# Patient Record
Sex: Male | Born: 1940 | Race: White | Hispanic: No | Marital: Single | Smoking: Never smoker
Health system: Southern US, Community
[De-identification: ages and names within clinical notes are randomized; demographics above are authoritative.]

## PROBLEM LIST (undated history)

## (undated) DIAGNOSIS — R42 Dizziness and giddiness: Secondary | ICD-10-CM

## (undated) HISTORY — PX: APPENDECTOMY: SHX54

## (undated) HISTORY — PX: LEG SURGERY: SHX1003

## (undated) HISTORY — PX: HERNIA REPAIR: SHX51

---

## 2013-09-01 ENCOUNTER — Emergency Department (INDEPENDENT_AMBULATORY_CARE_PROVIDER_SITE_OTHER): Payer: BC Managed Care – PPO

## 2013-09-01 ENCOUNTER — Emergency Department (INDEPENDENT_AMBULATORY_CARE_PROVIDER_SITE_OTHER)
Admission: EM | Admit: 2013-09-01 | Discharge: 2013-09-01 | Disposition: A | Discharge status: Home / Self Care | Payer: BC Managed Care – PPO | Source: Home / Self Care | Attending: Family Medicine | Admitting: Family Medicine

## 2013-09-01 ENCOUNTER — Encounter (HOSPITAL_COMMUNITY): Payer: Self-pay | Admitting: Emergency Medicine

## 2013-09-01 DIAGNOSIS — J069 Acute upper respiratory infection, unspecified: Secondary | ICD-10-CM

## 2013-09-01 HISTORY — DX: Dizziness and giddiness: R42

## 2013-09-01 LAB — POCT RAPID STREP A: STREPTOCOCCUS, GROUP A SCREEN (DIRECT): NEGATIVE

## 2013-09-01 MED ORDER — BENZONATATE 100 MG PO CAPS: 100.0000 mg | ORAL_CAPSULE | Freq: Three times a day (TID) | ORAL | Status: DC | PRN

## 2013-09-01 MED ORDER — IPRATROPIUM BROMIDE 0.06 % NA SOLN: 2.0000 | Freq: Four times a day (QID) | NASAL | Status: AC

## 2013-09-01 NOTE — ED Notes (Signed)
Reports cough started Monday night ( 2/23).  Since then symptoms have worsened.  Cough is worse to the point of having "coughing spells" c/o sore throat, rarely productive cough, but has coughed up yellow phlegm.  Patient mentions pain in left chest and hip: says this is related to lying down too long.  No chest pain since then nor now

## 2013-09-01 NOTE — ED Provider Notes (Signed)
CSN: [id number]     Arrival date & time 09/01/13  4098 History   First MD Initiated Contact with Patient 09/01/13 [phone]     Chief Complaint  Patient presents with  . Sore Throat  . Cough   (Consider location/radiation/quality/duration/timing/severity/associated sxs/prior Treatment) HPI Comments: Non-smoker. Here from [place] visiting family  Patient is a 73 y.o. male presenting with cough and URI.  Cough Associated symptoms: rhinorrhea and sore throat   Associated symptoms: no ear pain, no fever, no headaches, no myalgias and no wheezing   URI Presenting symptoms: congestion, cough, rhinorrhea and sore throat   Presenting symptoms: no ear pain, no facial pain, no fatigue and no fever   Severity:  Moderate Onset quality:  Gradual Duration:  4 days Timing:  Constant Progression:  Unchanged Chronicity:  New Associated symptoms: no arthralgias, no headaches, no myalgias, no neck pain, no sinus pain, no sneezing, no swollen glands and no wheezing   Risk factors: recent travel     Past Medical History  Diagnosis Date  . Vertigo    Past Surgical History  Procedure Laterality Date  . Hernia repair    . Appendectomy    . Leg surgery     No family history on file. History  Substance Use Topics  . Smoking status: Never Smoker   . Smokeless tobacco: Not on file  . Alcohol Use: No    Review of Systems  Constitutional: Negative for fever and fatigue.  HENT: Positive for congestion, rhinorrhea and sore throat. Negative for ear pain and sneezing.   Respiratory: Positive for cough. Negative for wheezing.   Musculoskeletal: Negative for arthralgias, myalgias and neck pain.  Neurological: Negative for headaches.  All other systems reviewed and are negative.    Allergies  Codeine  Home Medications   Current Outpatient Rx  Name  Route  Sig  Dispense  Refill  . Pseudoeph-Doxylamine-DM-APAP (NYQUIL PO)   Oral   Take by mouth.         . Triamterene-HCTZ (DYAZIDE PO)  Oral   Take by mouth.         . benzonatate (TESSALON) 100 MG capsule   Oral   Take 1 capsule (100 mg total) by mouth 3 (three) times daily as needed for cough.   21 capsule   0   . ipratropium (ATROVENT) 0.06 % nasal spray   Each Nare   Place 2 sprays into both nostrils 4 (four) times daily.   15 mL   0    BP 129/76  Pulse 60  Temp(Src) 98.3 F (36.8 C) (Oral)  Resp 20  SpO2 97% Physical Exam  Nursing note and vitals reviewed. Constitutional: He is oriented to person, place, and time. He appears well-developed and well-nourished.  HENT:  Head: Normocephalic and atraumatic.  Right Ear: Hearing, tympanic membrane, external ear and ear canal normal.  Left Ear: Hearing, tympanic membrane, external ear and ear canal normal.  Nose: Nose normal.  Mouth/Throat: Uvula is midline and mucous membranes are normal. No oral lesions. No trismus in the jaw. Posterior oropharyngeal erythema present. No oropharyngeal exudate, posterior oropharyngeal edema or tonsillar abscesses.  Eyes: Conjunctivae are normal.  Neck: Normal range of motion. Neck supple.  Cardiovascular: Normal rate, regular rhythm and normal heart sounds.   Pulmonary/Chest: Effort normal and breath sounds normal.  Abdominal: Soft. Bowel sounds are normal. He exhibits no distension. There is no tenderness.  Musculoskeletal: Normal range of motion.  Lymphadenopathy:    He has no cervical adenopathy.  Neurological: He is alert and oriented to person, place, and time.  Skin: Skin is warm and dry. No rash noted.  Psychiatric: He has a normal mood and affect. His behavior is normal.    ED Course  Procedures (including critical care time) Labs Review Labs Reviewed  POCT RAPID STREP A ([facility] CARE ONLY)   Imaging Review Dg Chest 2 View  09/01/2013   CLINICAL DATA:  Cough, sore through for 1 week  EXAM: CHEST  2 VIEW  COMPARISON:  None.  FINDINGS: No active infiltrate or effusion is seen. Mediastinal contours appear  normal. The heart is within upper limits of normal. There are degenerative changes in the mid to lower thoracic spine.  IMPRESSION: No active lung disease.   Electronically Signed   By: Dwyane DeePaul  Barry M.D.   On: 09/01/2013 10:52     MDM   1. URI (upper respiratory infection)    Common Cold: rapid strep negative. CXR without bronchitis or pneumonia. Symptomatic care at home with good hydration, tylenol, atrovent nasal spray and tessalon. Follow up if no improvement in 4-5 days or fever or dyspnea.   Jess BartersJennifer Lee [place] Belle[place], [place] 09/01/13 1106

## 2013-09-01 NOTE — Discharge Instructions (Signed)
Your test for strep throat was negative and your xray was without evidence of bronchitis or pneumonia. Please use tylenol as directed on packaging for aches/fever. Medications as prescribed for nasal congestion and cough. If symptoms do not begin to improve over the next 4-5 days, please follow up for re-evaluation.

## 2013-09-03 LAB — CULTURE, GROUP A STREP

## 2013-09-03 NOTE — ED Provider Notes (Signed)
Medical screening examination/treatment/procedure(s) were performed by a resident physician or non-physician practitioner and as the supervising physician I was immediately available for consultation/collaboration.  Evan Corey, MD    Evan S Corey, MD 09/03/13 0838 

## 2013-09-04 ENCOUNTER — Emergency Department (HOSPITAL_COMMUNITY): Payer: MEDICARE

## 2013-09-04 ENCOUNTER — Encounter (HOSPITAL_COMMUNITY): Payer: Self-pay | Admitting: Emergency Medicine

## 2013-09-04 ENCOUNTER — Inpatient Hospital Stay (HOSPITAL_COMMUNITY)
Admission: EM | Admit: 2013-09-04 | Discharge: 2013-09-07 | DRG: 195 | Disposition: A | Discharge status: Home / Self Care | Payer: MEDICARE | Attending: Internal Medicine | Admitting: Internal Medicine

## 2013-09-04 DIAGNOSIS — J1100 Influenza due to unidentified influenza virus with unspecified type of pneumonia: Principal | ICD-10-CM | POA: Diagnosis present

## 2013-09-04 DIAGNOSIS — E860 Dehydration: Secondary | ICD-10-CM

## 2013-09-04 DIAGNOSIS — E876 Hypokalemia: Secondary | ICD-10-CM

## 2013-09-04 DIAGNOSIS — J189 Pneumonia, unspecified organism: Secondary | ICD-10-CM | POA: Insufficient documentation

## 2013-09-04 DIAGNOSIS — R197 Diarrhea, unspecified: Secondary | ICD-10-CM

## 2013-09-04 DIAGNOSIS — R42 Dizziness and giddiness: Secondary | ICD-10-CM | POA: Diagnosis present

## 2013-09-04 DIAGNOSIS — Z7982 Long term (current) use of aspirin: Secondary | ICD-10-CM

## 2013-09-04 DIAGNOSIS — J111 Influenza due to unidentified influenza virus with other respiratory manifestations: Secondary | ICD-10-CM

## 2013-09-04 DIAGNOSIS — R630 Anorexia: Secondary | ICD-10-CM | POA: Diagnosis present

## 2013-09-04 LAB — COMPREHENSIVE METABOLIC PANEL
ALT: 20 U/L (ref 0–53)
AST: 29 U/L (ref 0–37)
Albumin: 3.2 g/dL — ABNORMAL LOW (ref 3.5–5.2)
Alkaline Phosphatase: 42 U/L (ref 39–117)
BILIRUBIN TOTAL: 0.5 mg/dL (ref 0.3–1.2)
BUN: 13 mg/dL (ref 6–23)
CALCIUM: 8.6 mg/dL (ref 8.4–10.5)
CHLORIDE: 91 meq/L — AB (ref 96–112)
CO2: 29 mEq/L (ref 19–32)
CREATININE: 0.86 mg/dL (ref 0.50–1.35)
GFR calc non Af Amer: 83 mL/min — ABNORMAL LOW (ref >90)
Glucose, Bld: 115 mg/dL — ABNORMAL HIGH (ref 70–99)
Potassium: 2.9 mEq/L — CL (ref 3.7–5.3)
SODIUM: 135 meq/L — AB (ref 137–147)
Total Protein: 7 g/dL (ref 6.0–8.3)

## 2013-09-04 LAB — CBG MONITORING, ED: Glucose-Capillary: 134 mg/dL — ABNORMAL HIGH (ref 70–99)

## 2013-09-04 LAB — URINALYSIS, ROUTINE W REFLEX MICROSCOPIC
Glucose, UA: NEGATIVE mg/dL
Ketones, ur: 15 mg/dL — AB
Leukocytes, UA: NEGATIVE
NITRITE: NEGATIVE
PROTEIN: NEGATIVE mg/dL
Specific Gravity, Urine: 1.021 (ref 1.005–1.030)
UROBILINOGEN UA: 1 mg/dL (ref 0.0–1.0)
pH: 6 (ref 5.0–8.0)

## 2013-09-04 LAB — CBC
HCT: 37.4 % — ABNORMAL LOW (ref 39.0–52.0)
HEMOGLOBIN: 13.6 g/dL (ref 13.0–17.0)
MCH: 33.1 pg (ref 26.0–34.0)
MCHC: 36.4 g/dL — ABNORMAL HIGH (ref 30.0–36.0)
MCV: 91 fL (ref 78.0–100.0)
PLATELETS: 122 10*3/uL — AB (ref 150–400)
RBC: 4.11 MIL/uL — ABNORMAL LOW (ref 4.22–5.81)
RDW: 12.7 % (ref 11.5–15.5)
WBC: 9.6 10*3/uL (ref 4.0–10.5)

## 2013-09-04 LAB — INFLUENZA PANEL BY PCR (TYPE A & B)
H1N1 flu by pcr: NOT DETECTED
INFLAPCR: NEGATIVE
INFLBPCR: NEGATIVE

## 2013-09-04 LAB — URINE MICROSCOPIC-ADD ON

## 2013-09-04 LAB — CK: CK TOTAL: 252 U/L — AB (ref 7–232)

## 2013-09-04 LAB — I-STAT TROPONIN, ED: Troponin i, poc: 0.03 ng/mL (ref 0.00–0.08)

## 2013-09-04 LAB — MAGNESIUM: Magnesium: 1.8 mg/dL (ref 1.5–2.5)

## 2013-09-04 LAB — I-STAT CG4 LACTIC ACID, ED: Lactic Acid, Venous: 1.73 mmol/L (ref 0.5–2.2)

## 2013-09-04 MED ORDER — ACETAMINOPHEN 325 MG PO TABS
650.0000 mg | ORAL_TABLET | Freq: Four times a day (QID) | ORAL | Status: DC | PRN
  Administered 2013-09-07: 650 mg via ORAL
  Filled 2013-09-04: qty 2

## 2013-09-04 MED ORDER — ONDANSETRON HCL 4 MG PO TABS
4.0000 mg | ORAL_TABLET | Freq: Four times a day (QID) | ORAL | Status: DC | PRN
  Administered 2013-09-07: 4 mg via ORAL
  Filled 2013-09-04: qty 1

## 2013-09-04 MED ORDER — ACETAMINOPHEN 650 MG RE SUPP: 650.0000 mg | Freq: Four times a day (QID) | RECTAL | Status: DC | PRN

## 2013-09-04 MED ORDER — ONDANSETRON HCL 4 MG/2ML IJ SOLN
4.0000 mg | Freq: Four times a day (QID) | INTRAMUSCULAR | Status: DC | PRN
  Administered 2013-09-06: 4 mg via INTRAVENOUS
  Filled 2013-09-04: qty 2

## 2013-09-04 MED ORDER — SODIUM CHLORIDE 0.9 % IV BOLUS (SEPSIS)
1000.0000 mL | Freq: Once | INTRAVENOUS | Status: AC
  Administered 2013-09-04: 1000 mL via INTRAVENOUS

## 2013-09-04 MED ORDER — SODIUM CHLORIDE 0.9 % IV BOLUS (SEPSIS)
1000.0000 mL | Freq: Once | INTRAVENOUS | Status: AC
Start: 2013-09-04 — End: 2013-09-04
  Administered 2013-09-04: 1000 mL via INTRAVENOUS

## 2013-09-04 MED ORDER — ASPIRIN EC 81 MG PO TBEC
81.0000 mg | DELAYED_RELEASE_TABLET | Freq: Every day | ORAL | Status: DC
  Administered 2013-09-04 – 2013-09-07 (×4): 81 mg via ORAL
  Filled 2013-09-04 (×4): qty 1

## 2013-09-04 MED ORDER — SODIUM CHLORIDE 0.9 % IV SOLN
INTRAVENOUS | Status: DC
  Administered 2013-09-04: 21:00:00 via INTRAVENOUS
  Administered 2013-09-04: 150 mL/h via INTRAVENOUS
  Administered 2013-09-05 – 2013-09-06 (×3): via INTRAVENOUS

## 2013-09-04 MED ORDER — OSELTAMIVIR PHOSPHATE 75 MG PO CAPS
75.0000 mg | ORAL_CAPSULE | Freq: Two times a day (BID) | ORAL | Status: DC
  Administered 2013-09-04 – 2013-09-07 (×6): 75 mg via ORAL
  Filled 2013-09-04 (×7): qty 1

## 2013-09-04 MED ORDER — POTASSIUM CHLORIDE 10 MEQ/100ML IV SOLN
10.0000 meq | Freq: Once | INTRAVENOUS | Status: AC
  Administered 2013-09-04: 10 meq via INTRAVENOUS
  Filled 2013-09-04: qty 100

## 2013-09-04 MED ORDER — GUAIFENESIN-DM 100-10 MG/5ML PO SYRP
5.0000 mL | ORAL_SOLUTION | ORAL | Status: DC | PRN
  Administered 2013-09-06 – 2013-09-07 (×2): 5 mL via ORAL
  Filled 2013-09-04 (×2): qty 5

## 2013-09-04 MED ORDER — POTASSIUM CHLORIDE CRYS ER 20 MEQ PO TBCR
40.0000 meq | EXTENDED_RELEASE_TABLET | Freq: Once | ORAL | Status: AC
  Administered 2013-09-04: 40 meq via ORAL
  Filled 2013-09-04: qty 2

## 2013-09-04 MED ORDER — BENZONATATE 100 MG PO CAPS
100.0000 mg | ORAL_CAPSULE | Freq: Three times a day (TID) | ORAL | Status: DC | PRN
  Filled 2013-09-04: qty 1

## 2013-09-04 MED ORDER — HEPARIN SODIUM (PORCINE) 5000 UNIT/ML IJ SOLN
5000.0000 [IU] | Freq: Three times a day (TID) | INTRAMUSCULAR | Status: DC
  Administered 2013-09-04 – 2013-09-07 (×9): 5000 [IU] via SUBCUTANEOUS
  Filled 2013-09-04 (×11): qty 1

## 2013-09-04 MED ORDER — ACETAMINOPHEN 500 MG PO TABS: 1000.0000 mg | ORAL_TABLET | Freq: Four times a day (QID) | ORAL | Status: DC | PRN

## 2013-09-04 MED ORDER — SODIUM CHLORIDE 0.9 % IJ SOLN
3.0000 mL | Freq: Two times a day (BID) | INTRAMUSCULAR | Status: DC
  Administered 2013-09-06 – 2013-09-07 (×2): 3 mL via INTRAVENOUS

## 2013-09-04 NOTE — ED Provider Notes (Signed)
CSN: [id number]     Arrival date & time 09/04/13  1017 History   First MD Initiated Contact with Patient 09/04/13 1100     Chief Complaint  Patient presents with  . Weakness  . Altered Mental Status     (Consider location/radiation/quality/duration/timing/severity/associated sxs/prior Treatment) HPI Patient presents to the emergency department with cough, myalgias, and dehydration.  The patient started having these symptoms 1 week ago, but it got worse over that time.  He was seen at urgent care given, cough suppressant.  Chest x-ray was negative at that time.  Patient's daughter states, that patient seems confused.  Patient denies chest pain, shortness of breath, nausea, vomiting, headache, blurred vision, weakness, numbness, dizziness, syncope, or rash.  Patient did not take any other medications prior to arrival.  Nothing seems to make the patient's condition, better or worse Past Medical History  Diagnosis Date  . Vertigo    Past Surgical History  Procedure Laterality Date  . Hernia repair    . Appendectomy    . Leg surgery     No family history on file. History  Substance Use Topics  . Smoking status: Never Smoker   . Smokeless tobacco: Not on file  . Alcohol Use: No    Review of Systems  All other systems negative except as documented in the HPI. All pertinent positives and negatives as reviewed in the HPI.  Allergies  Codeine  Home Medications   Current Outpatient Rx  Name  Route  Sig  Dispense  Refill  . acetaminophen (TYLENOL) 500 MG tablet   Oral   Take 1,000 mg by mouth every 6 (six) hours as needed for mild pain.         Marland Kitchen aspirin EC 81 MG tablet   Oral   Take 81 mg by mouth daily.         . benzonatate (TESSALON) 100 MG capsule   Oral   Take 1 capsule (100 mg total) by mouth 3 (three) times daily as needed for cough.   21 capsule   0   . docusate sodium (COLACE) 100 MG capsule   Oral   Take 100 mg by mouth daily.         Marland Kitchen ipratropium  (ATROVENT) 0.06 % nasal spray   Each Nare   Place 2 sprays into both nostrils 4 (four) times daily.   15 mL   0   . Pseudoeph-Doxylamine-DM-APAP (NYQUIL PO)   Oral   Take 15 mLs by mouth at bedtime.          . triamterene-hydrochlorothiazide (DYAZIDE) 37.5-25 MG per capsule   Oral   Take 1 capsule by mouth daily.          BP 111/54  Pulse 48  Temp(Src) 97.8 F (36.6 C) (Oral)  Resp 22  Ht 5\' 5"  (1.651 m)  Wt 150 lb (68.04 kg)  BMI 24.96 kg/m2  SpO2 99% Physical Exam  Nursing note and vitals reviewed. Constitutional: He is oriented to person, place, and time. He appears well-developed and well-nourished. No distress.  HENT:  Head: Normocephalic and atraumatic.  Mouth/Throat: Oropharynx is clear and moist.  Eyes: Pupils are equal, round, and reactive to light.  Neck: Normal range of motion. Neck supple.  Cardiovascular: Normal rate, regular rhythm and normal heart sounds.  Exam reveals no gallop and no friction rub.   No murmur heard. Pulmonary/Chest: Effort normal and breath sounds normal. No respiratory distress.  Neurological: He is alert and oriented to  person, place, and time. He exhibits normal muscle tone. Coordination normal.  Skin: Skin is warm and dry.    ED Course  Procedures (including critical care time) Labs Review Labs Reviewed  CBC - Abnormal; Notable for the following:    RBC 4.11 (*)    HCT 37.4 (*)    MCHC 36.4 (*)    Platelets 122 (*)    All other components within normal limits  COMPREHENSIVE METABOLIC PANEL - Abnormal; Notable for the following:    Sodium 135 (*)    Potassium 2.9 (*)    Chloride 91 (*)    Glucose, Bld 115 (*)    Albumin 3.2 (*)    GFR calc non Af Amer 83 (*)    All other components within normal limits  URINALYSIS, ROUTINE W REFLEX MICROSCOPIC - Abnormal; Notable for the following:    Color, Urine AMBER (*)    Hgb urine dipstick SMALL (*)    Bilirubin Urine SMALL (*)    Ketones, ur 15 (*)    All other components  within normal limits  URINE MICROSCOPIC-ADD ON - Abnormal; Notable for the following:    Bacteria, UA FEW (*)    All other components within normal limits  CBG MONITORING, ED - Abnormal; Notable for the following:    Glucose-Capillary 134 (*)    All other components within normal limits  INFLUENZA PANEL BY PCR (TYPE A & B, H1N1)  I-STAT CG4 LACTIC ACID, ED   Imaging Review Dg Chest 2 View  09/04/2013   CLINICAL DATA:  Altered mental status  EXAM: CHEST  2 VIEW  COMPARISON:  September 01, 2013  FINDINGS: There is no edema or consolidation. Heart size and pulmonary vascularity are normal. No adenopathy. No bone lesions.  IMPRESSION: No edema or consolidation.   Electronically Signed   By: Bretta BangWilliam  Woodruff M.D.   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  Family is advised of the plan and all questions were answered.  Spoke with the, [facility], who will admit the patient for further evaluation  Carlyle DollyChristopher W Adon Gehlhausen, PA-C 09/05/13 1548

## 2013-09-04 NOTE — ED Notes (Signed)
Abnormal results for K+ given to CL

## 2013-09-04 NOTE — H&P (Signed)
Date: 09/04/2013               Patient Name:  Francisco Dominguez Amedee MRN: [id number]  DOB: Jun 08, 1941 Age / Sex: 73 y.o., male   PCP: No Pcp Per Patient         Medical Service: Internal Medicine Teaching Service         Attending Physician: Dr. Farley LyJerry Dale Joines, MD    First Contact: Dr. Angelina Sheriffhris Slayde Brault, MD Pager: [phone]  Second Contact: Dr. Christen BameNora Sadek, MD Pager: [phone]       After Hours (After 5p/  First Contact Pager: [phone]  weekends / holidays): Second Contact Pager: [phone]   Chief Complaint: AMS  History of Present Illness: Francisco Dominguez Trim is Dominguez 73 y.o. man who presents with his daughter with Dominguez cc of AMS. [place] was [place] from [place] when he developed chills, cough, myalgias, and rhinorrhea over the last week. He as seen in urgent care on 2/27 for this complain and was diagnosed with URI and was given tessalon. However, he failed to improve and developed diarrhea. He also complained of anorexia over the last 48 hours. He became increasingly weak and was having trouble sleeping. Last night he took NyQuil and continued to have trouble sleeping. When his daughter spoke with him this morning he was altered. Throughout the day he has had trouble providing accurate history, using the cell phone, and paying attention to conversations.   Meds: No current facility-administered medications for this encounter.   Current Outpatient Prescriptions  Medication Sig Dispense Refill  . acetaminophen (TYLENOL) 500 MG tablet Take 1,000 mg by mouth every 6 (six) hours as needed for mild pain.      Marland Kitchen. aspirin EC 81 MG tablet Take 81 mg by mouth daily.      . benzonatate (TESSALON) 100 MG capsule Take 1 capsule (100 mg total) by mouth 3 (three) times daily as needed for cough.  21 capsule  0  . docusate sodium (COLACE) 100 MG capsule Take 100 mg by mouth daily.      Marland Kitchen. ipratropium (ATROVENT) 0.06 % nasal spray Place 2 sprays into both nostrils 4 (four) times daily.  15 mL  0  . Pseudoeph-Doxylamine-DM-APAP  (NYQUIL PO) Take 15 mLs by mouth at bedtime.       . triamterene-hydrochlorothiazide (DYAZIDE) 37.5-25 MG per capsule Take 1 capsule by mouth daily.        Allergies: Allergies as of 09/04/2013 - Review Complete 09/04/2013  Allergen Reaction Noted  . Codeine Nausea And Vomiting 09/01/2013   Past Medical History  Diagnosis Date  . Vertigo    Past Surgical History  Procedure Laterality Date  . Hernia repair    . Appendectomy    . Leg surgery     No family history on file. History   Social History  . Marital Status: Single    Spouse Name: N/Dominguez    Number of Children: N/Dominguez  . Years of Education: N/Dominguez   Occupational History  . Not on file.   Social History Main Topics  . Smoking status: Never Smoker   . Smokeless tobacco: Not on file  . Alcohol Use: No  . Drug Use: No  . Sexual Activity: Not on file   Other Topics Concern  . Not on file   Social History Narrative  . No narrative on file    Review of Systems: Review of Systems  Constitutional: Positive for chills and malaise/fatigue. Negative for fever and diaphoresis.  HENT: Positive for congestion and sore throat. Negative for hearing loss and tinnitus.   Eyes: Negative for blurred vision, double vision, photophobia, pain, discharge and redness.  Respiratory: Positive for cough. Negative for hemoptysis, sputum production, shortness of breath and wheezing.   Cardiovascular: Negative for chest pain, palpitations, orthopnea, claudication, leg swelling and PND.  Gastrointestinal: Positive for nausea and diarrhea. Negative for heartburn, vomiting, abdominal pain, constipation, blood in stool and melena.  Genitourinary: Negative for dysuria, urgency and frequency.  Musculoskeletal: Positive for myalgias. Negative for back pain and neck pain.  Skin: Negative for itching and rash.  Neurological: Positive for weakness. Negative for dizziness, tingling, tremors, sensory change, speech change, focal weakness, seizures, loss of  consciousness and headaches.     Physical Exam: Blood pressure 111/54, pulse 48, temperature 97.8 F (36.6 C), temperature source Oral, resp. rate 22, height 5\' 5"  (1.651 m), weight 150 lb (68.04 kg), SpO2 99.00%. Physical Exam  Constitutional: He is oriented to person, place, and time. He appears well-developed and well-nourished. No distress.  HENT:  Head: Normocephalic and atraumatic.  Mouth/Throat: No oropharyngeal exudate.  Tachy mucous membranes. Mild posterior pharyngeal erythema  Eyes: EOM are normal. Pupils are equal, round, and reactive to light. Right eye exhibits no discharge. Left eye exhibits no discharge.  Cardiovascular: Regular rhythm, normal heart sounds and intact distal pulses.  Exam reveals no gallop and no friction rub.   No murmur heard. bradycardia  Pulmonary/Chest: Effort normal and breath sounds normal. No respiratory distress. He has no wheezes. He exhibits no tenderness.  Mild bibasilar crackles  Abdominal: Soft. Bowel sounds are normal. He exhibits no distension and no mass. There is no tenderness. There is no rebound and no guarding. No hernia.  Neurological: He is alert and oriented to person, place, and time.  Unable to provide accurate history. History provided by daughter.  Skin: Skin is warm and dry. No rash noted. He is not diaphoretic. No erythema.  Psychiatric: He has Dominguez normal mood and affect. His behavior is normal.     Lab results: Basic Metabolic Panel:  Recent Labs  40/98/11 1056  NA 135*  K 2.9*  CL 91*  CO2 29  GLUCOSE 115*  BUN 13  CREATININE 0.86  CALCIUM 8.6   Liver Function Tests:  Recent Labs  09/04/13 1056  AST 29  ALT 20  ALKPHOS 42  BILITOT 0.5  PROT 7.0  ALBUMIN 3.2*   CBC:  Recent Labs  09/04/13 1056  WBC 9.6  HGB 13.6  HCT 37.4*  MCV 91.0  PLT 122*   CBG:  Recent Labs  09/04/13 1050  GLUCAP 134*   Urinalysis:  Recent Labs  09/04/13 1401  COLORURINE AMBER*  LABSPEC 1.021  PHURINE 6.0    GLUCOSEU NEGATIVE  HGBUR SMALL*  BILIRUBINUR SMALL*  KETONESUR 15*  PROTEINUR NEGATIVE  UROBILINOGEN 1.0  NITRITE NEGATIVE  LEUKOCYTESUR NEGATIVE   Misc. Labs:  CK = pending  Influenza = pending  Imaging results:  Dg Chest 2 View  09/04/2013   CLINICAL DATA:  Altered mental status  EXAM: CHEST  2 VIEW  COMPARISON:  September 01, 2013  FINDINGS: There is no edema or consolidation. Heart size and pulmonary vascularity are normal. No adenopathy. No bone lesions.  IMPRESSION: No edema or consolidation.   Electronically Signed   By: Bretta Bang M.D.   On: 09/04/2013 11:57    Other results: EKG: sinus, nonspec IVD. LVH.  Assessment & Plan by Problem: Principal Problem:  URI (upper respiratory infection) Active Problems:   Hypokalemia   Dehydration  URI The patient likely has influenza complicated by anorexia and diarrhea. Other infectious etiologies such as UTI and PNA are unlikely given nl CXR and UA not indicative of infection. Patient denies chest pain and troponin is negative. EKG is not indicative of acute ischemia. Rhabdomyolysis is possible given positive hg on dipstick with no RBCs on UA. - Tamiflu - F/U CK for possible rhabdomyolysis - continue IVFs  - Robitussin DM prn cough - F/U morning labs  Hypokalemia Likely due to GI loss and decreased PO intake. Encouraged patient to eat. Will continue IVFs and will replete K with PO KDur.  Dehydration The patient's dehydration is likely due to GI loss, decreased PO intake and ongoing diuretic use.  - Holding diuretic - Bolus 1 L in ED prior to admission - Bolus 1 L additional followed by 150/hr for 10 hours.   Diarrhea Patients last episode of Diarrhea was yesterday afternoon. No complaints of black or bloody stool. Will continue to monitor. If it continues may consider additional workup and treatment.  - Diet as tolerated - Holding home colace - IVFs as above  Vertigo Patient takes triamterene-HCTZ for  vertigo. - Hold for now in setting of dehydration and decreased PO intake  Diet: Regular DVT: Heparin sq  Dispo: Disposition is deferred at this time, awaiting improvement of current medical problems. Anticipated discharge in approximately 1-2 day(s).   The patient does have Dominguez current PCP (No Pcp Per Patient) and does not need an [facility] hospital follow-up appointment after discharge.  The patient does not have transportation limitations that hinder transportation to clinic appointments.  Signed: Pleas Koch, MD 09/04/2013, 3:22 PM

## 2013-09-04 NOTE — Progress Notes (Signed)
Francisco LlanoJohn A Dominguez [id number] Admission Data: 09/04/2013 7:52 PM Attending Provider: Farley LyJerry Dale Joines, MD  PCP:No PCP Per Patient Consults/ Treatment Team:    Francisco Dominguez is a 73 y.o. male patient admitted from ED awake, alert  & orientated  X 3,  Full Code, VSS - Blood pressure 92/52, pulse 51, temperature 98.2 F (36.8 C), temperature source Oral, resp. rate 18, height 5\' 5"  (1.651 m), weight 68.04 kg (150 lb), SpO2 95.00%.,no c/o shortness of breath, no c/o chest pain, no distress noted. Tele # 20 placed.   IV site WDL:  R forearm dated the 1/3/083/2/15 with a transparent dsg that's clean dry and intact running as NS at 50.  Allergies:   Allergies  Allergen Reactions  . Codeine Nausea And Vomiting     Past Medical History  Diagnosis Date  . Vertigo    Pt orientation to unit, room and routine. Information packet given to patient/family and safety video watched.  Admission INP armband ID verified with patient/family, and in place. SR up x 2, fall risk assessment complete with Patient and family verbalizing understanding of risks associated with falls. Pt verbalizes an understanding of how to use the call bell and to call for help before getting out of bed.  Skin, clean-dry- intact without evidence of bruising, or skin tears.   No evidence of skin break down noted on exam.     Will cont to monitor and assist as needed.  Kern ReapBrumagin, Lanah Steines L, RN 09/04/2013 7:52 PM

## 2013-09-04 NOTE — ED Notes (Signed)
Lactic acid results shown to Dr. Beaton 

## 2013-09-04 NOTE — ED Notes (Signed)
Family concerned about pt still confused; PA aware to see pt

## 2013-09-04 NOTE — ED Notes (Signed)
Pt sts some chills and aches but no documented fever; pt sts some diaphoresis after taking tylenol

## 2013-09-04 NOTE — ED Notes (Signed)
Notified RN of CBG 134

## 2013-09-04 NOTE — ED Notes (Signed)
[facility] due to having dizziness and confusion.  Patient is back today due to ongoing weakness and perceived confusion by the family.  Patient was reported to be in a daze at home.  He couldn't keep his eyes open.  Patient admits to not drinking much water.  Patient admits to poor po intake.  Patient has noted congested cough.  Patient with chest xray completed on Friday.  He has had periods of shaking at home. No reported fevers.

## 2013-09-05 ENCOUNTER — Inpatient Hospital Stay (HOSPITAL_COMMUNITY): Payer: MEDICARE

## 2013-09-05 DIAGNOSIS — E876 Hypokalemia: Secondary | ICD-10-CM

## 2013-09-05 DIAGNOSIS — J069 Acute upper respiratory infection, unspecified: Secondary | ICD-10-CM

## 2013-09-05 DIAGNOSIS — R42 Dizziness and giddiness: Secondary | ICD-10-CM

## 2013-09-05 DIAGNOSIS — E860 Dehydration: Secondary | ICD-10-CM

## 2013-09-05 LAB — CBC
HCT: 36.2 % — ABNORMAL LOW (ref 39.0–52.0)
Hemoglobin: 12.6 g/dL — ABNORMAL LOW (ref 13.0–17.0)
MCH: 32.4 pg (ref 26.0–34.0)
MCHC: 34.8 g/dL (ref 30.0–36.0)
MCV: 93.1 fL (ref 78.0–100.0)
Platelets: 111 10*3/uL — ABNORMAL LOW (ref 150–400)
RBC: 3.89 MIL/uL — ABNORMAL LOW (ref 4.22–5.81)
RDW: 13.3 % (ref 11.5–15.5)
WBC: 5.2 10*3/uL (ref 4.0–10.5)

## 2013-09-05 LAB — BASIC METABOLIC PANEL
BUN: 10 mg/dL (ref 6–23)
CO2: 28 mEq/L (ref 19–32)
CREATININE: 0.76 mg/dL (ref 0.50–1.35)
Calcium: 8 mg/dL — ABNORMAL LOW (ref 8.4–10.5)
Chloride: 104 mEq/L (ref 96–112)
GFR calc Af Amer: >90 mL/min (ref >90)
GFR, EST NON AFRICAN AMERICAN: 88 mL/min — AB (ref >90)
GLUCOSE: 109 mg/dL — AB (ref 70–99)
POTASSIUM: 4.1 meq/L (ref 3.7–5.3)
Sodium: 143 mEq/L (ref 137–147)

## 2013-09-05 LAB — CK: CK TOTAL: 260 U/L — AB (ref 7–232)

## 2013-09-05 MED ORDER — FUROSEMIDE 20 MG PO TABS
20.0000 mg | ORAL_TABLET | Freq: Once | ORAL | Status: AC
  Administered 2013-09-05: 20 mg via ORAL
  Filled 2013-09-05: qty 1

## 2013-09-05 MED ORDER — ENSURE PUDDING PO PUDG: 1.0000 | Freq: Three times a day (TID) | ORAL | Status: DC

## 2013-09-05 NOTE — Progress Notes (Signed)
Spoke with the MD earlier this afternoon, informed him of loose bowel movements. No terrible odor noted/does not appear to look like C-diff, instructed to monitor. Pt. Had a total of 3 bowel movements.

## 2013-09-05 NOTE — Progress Notes (Signed)
Subjective:  Patient continues to have cold sweats and myalgias. His mental status is improved but not quite at baseline.   Objective: Vital signs in last 24 hours: Filed Vitals:   09/04/13 1951 09/04/13 2119 09/05/13 0542 09/05/13 0622  BP: 92/52 118/71 145/70 145/62  Pulse:  55 55 52  Temp:   97.6 F (36.4 C)   TempSrc:   Oral   Resp:   18   Height:      Weight:      SpO2:   92%    Weight change:   Intake/Output Summary (Last 24 hours) at 09/05/13 1031 Last data filed at 09/04/13 2105  Gross per 24 hour  Intake   1000 ml  Output      0 ml  Net   1000 ml   Physical Exam  Constitutional: He is oriented to person, place, and time. He appears well-developed and well-nourished. No distress.  HENT:  Head: Normocephalic.  Mouth/Throat: Oropharynx is clear and moist. No oropharyngeal exudate.  Cardiovascular: Normal rate, regular rhythm, normal heart sounds and intact distal pulses.  Exam reveals no friction rub.   No murmur heard. Pulmonary/Chest: Effort normal. He has rales.  Abdominal: Soft. Bowel sounds are normal. He exhibits no distension. There is no tenderness.  Neurological: He is alert and oriented to person, place, and time.  Skin: He is not diaphoretic.  Psychiatric: He has a normal mood and affect. His behavior is normal.     Lab Results: Basic Metabolic Panel:  Recent Labs Lab 09/04/13 1056 09/04/13 1644 09/05/13 0426  NA 135*  --  143  K 2.9*  --  4.1  CL 91*  --  104  CO2 29  --  28  GLUCOSE 115*  --  109*  BUN 13  --  10  CREATININE 0.86  --  0.76  CALCIUM 8.6  --  8.0*  MG  --  1.8  --    Liver Function Tests:  Recent Labs Lab 09/04/13 1056  AST 29  ALT 20  ALKPHOS 42  BILITOT 0.5  PROT 7.0  ALBUMIN 3.2*   CBC:  Recent Labs Lab 09/04/13 1056 09/05/13 0426  WBC 9.6 5.2  HGB 13.6 12.6*  HCT 37.4* 36.2*  MCV 91.0 93.1  PLT 122* 111*   Cardiac Enzymes:  Recent Labs Lab 09/04/13 1514 09/05/13 0426  CKTOTAL 252* 260*     CBG:  Recent Labs Lab 09/04/13 1050  GLUCAP 134*   Urinalysis:  Recent Labs Lab 09/04/13 1401  COLORURINE AMBER*  LABSPEC 1.021  PHURINE 6.0  GLUCOSEU NEGATIVE  HGBUR SMALL*  BILIRUBINUR SMALL*  KETONESUR 15*  PROTEINUR NEGATIVE  UROBILINOGEN 1.0  NITRITE NEGATIVE  LEUKOCYTESUR NEGATIVE    Micro Results: Recent Results (from the past 240 hour(s))  CULTURE, GROUP A STREP     Status: None   Collection Time    09/01/13 10:00 AM      Result Value Ref Range Status   Specimen Description THROAT   Final   Special Requests NONE   Final   Culture     Final   Value: No Beta Hemolytic Streptococci Isolated     Performed at [facility]   Report Status 09/03/2013 FINAL   Final   Studies/Results: Dg Chest 2 View  09/04/2013   CLINICAL DATA:  Altered mental status  EXAM: CHEST  2 VIEW  COMPARISON:  September 01, 2013  FINDINGS: There is no edema or consolidation. Heart size and  pulmonary vascularity are normal. No adenopathy. No bone lesions.  IMPRESSION: No edema or consolidation.   Electronically Signed   By: Bretta BangWilliam  Woodruff M.D.   On: 09/04/2013 11:57   Ct Head Wo Contrast  09/04/2013   CLINICAL DATA:  Confusion.  Dizziness.  EXAM: CT HEAD WITHOUT CONTRAST  TECHNIQUE: Contiguous axial images were obtained from the base of the skull through the vertex without intravenous contrast.  COMPARISON:  None.  FINDINGS: Ventricles, cisterns and other CSF spaces are within normal. There is no mass, mass effect, shift of midline structures or acute hemorrhage. There is no evidence to suggest acute infarction. 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 Minimal bilateral basal ganglia calcifications. There is opacification over the ethmoid sinus with mucosal membrane thickening involving the left maxillary sinus and a very subtle air-fluid level over the deep and right maxillary sinus.  IMPRESSION: No acute intracranial findings.  Subtle chronic ischemic microvascular disease.   Sinus inflammatory disease most prominent over the left maxillary sinus.   Electronically Signed   By: Elberta Fortisaniel  Boyle M.D.   On: 09/04/2013 15:37   Medications: I have reviewed the patient's current medications. Scheduled Meds: . aspirin EC  81 mg Oral Daily  . heparin  5,000 Units Subcutaneous 3 times per day  . oseltamivir  75 mg Oral BID  . sodium chloride  3 mL Intravenous Q12H   Continuous Infusions: . sodium chloride 50 mL/hr at 09/05/13 1028   PRN Meds:.acetaminophen, acetaminophen, benzonatate, guaiFENesin-dextromethorphan, ondansetron (ZOFRAN) IV, ondansetron Assessment/Plan: Principal Problem:   URI (upper respiratory infection) Active Problems:   Hypokalemia   Dehydration   Diarrhea  URI  The patient likely has influenza complicated by anorexia and diarrhea. Other infectious etiologies such as UTI and PNA are unlikely given nl CXR and UA not indicative of infection. Patient denies chest pain and troponin is negative. EKG is not indicative of acute ischemia. Rhabdomyolysis is unlikely given near normal CK. Patient has done well overnight and is improving. However, he continues to have no appetitive. - Tamiflu  - decrease IVFs from 150 to 50/hr - Robitussin DM prn cough  - Follow up repeat chest xray  Hypokalemia  Likely due to GI loss and decreased PO intake. Follow up labs K is 4.3.  Dehydration  The patient's dehydration is likely due to GI loss, decreased PO intake and ongoing diuretic use.  - Holding diuretic  - Continue IVFs at 50 cc.hr until toerlating food and water.  Diarrhea  Appears resolved. - Holding home colace.  Vertigo  Patient takes triamterene-HCTZ for vertigo.  - Hold for now in setting of dehydration and decreased PO intake   Diet: Regular  DVT: Heparin sq   Dispo: Disposition is deferred at this time, awaiting improvement of current medical problems.  Anticipated discharge in approximately 1 day(s).   The patient does have a current PCP  (No Pcp Per Patient) and [facility].  The patient does not have transportation limitations that hinder transportation to clinic appointments.  .Services Needed at time of discharge: Y = Yes, Blank = No PT:   OT:   RN:   Equipment:   Other:     LOS: 1 day   Pleas Kochhristopher Annelies Coyt, MD 09/05/2013, 10:31 AM

## 2013-09-05 NOTE — Progress Notes (Signed)
RN noticed that patient's K+ was 2.9- patient does not have any oral or IV K+ ordered. RN has contacted MD covering for the night. MD stated that orders will be added if needed. Will continue to monitor patient

## 2013-09-05 NOTE — Progress Notes (Signed)
Utilization review completed.  

## 2013-09-05 NOTE — H&P (Signed)
Internal Medicine Attending Admission Note Date: 09/05/2013  Patient name: Francisco Dominguez Medical record number: [id number] Date of birth: Feb 21, 1941 Age: 73 y.o. Gender: male  I saw and evaluated the patient. I reviewed the resident's note and I agree with the resident's findings and plan as documented in the resident's note, with the following additional comments.  Chief Complaint(s): Altered mental status, recent sore throat, chills, cough, rhinorrhea, and myalgias  History - key components related to admission: Patient is a 73 year old man admitted with a one-week history of sore throat, chills, cough, rhinorrhea, myalgias, and some diarrhea, brought to the emergency department by his daughter because of altered mental status.  Patient reports being previously active and in good health.  Patient denies headache, sinus pain, chest pain, abdominal pain, dysuria, or urinary frequency; he also denies skin rash.   Physical Exam - key components related to admission:  Filed Vitals:   09/04/13 1951 09/04/13 2119 09/05/13 0542 09/05/13 0622  BP: 92/52 118/71 145/70 145/62  Pulse:  55 55 52  Temp:   97.6 F (36.4 C)   TempSrc:   Oral   Resp:   18   Height:      Weight:      SpO2:   92%    General: Alert, oriented; in no acute distress  Lungs: Bibasilar crackles, otherwise clear Heart: Regular; S1-S2, no S3, no S4, no murmurs Back: No CVA tenderness Abdomen: Bowel sounds present, soft, nontender; no hepatosplenomegaly Extremities: No edema   Lab results:   Basic Metabolic Panel:  Recent Labs  40/98/1101/08/20 1056 09/04/13 1644 09/05/13 0426  NA 135*  --  143  K 2.9*  --  4.1  CL 91*  --  104  CO2 29  --  28  GLUCOSE 115*  --  109*  BUN 13  --  10  CREATININE 0.86  --  0.76  CALCIUM 8.6  --  8.0*  MG  --  1.8  --     Liver Function Tests:  Recent Labs  09/04/13 1056  AST 29  ALT 20  ALKPHOS 42  BILITOT 0.5  PROT 7.0  ALBUMIN 3.2*     CBC:  Recent Labs   09/04/13 1056 09/05/13 0426  WBC 9.6 5.2  HGB 13.6 12.6*  HCT 37.4* 36.2*  MCV 91.0 93.1  PLT 122* 111*    Cardiac Enzymes:  Recent Labs  09/04/13 1514 09/05/13 0426  CKTOTAL 252* 260*      CBG:  Recent Labs  09/04/13 1050  GLUCAP 134*      Urinalysis    Component Value Date/Time   COLORURINE AMBER* 09/04/2013 1401   APPEARANCEUR CLEAR 09/04/2013 1401   LABSPEC 1.021 09/04/2013 1401   PHURINE 6.0 09/04/2013 1401   GLUCOSEU NEGATIVE 09/04/2013 1401   HGBUR SMALL* 09/04/2013 1401   BILIRUBINUR SMALL* 09/04/2013 1401   KETONESUR 15* 09/04/2013 1401   PROTEINUR NEGATIVE 09/04/2013 1401   UROBILINOGEN 1.0 09/04/2013 1401   NITRITE NEGATIVE 09/04/2013 1401   LEUKOCYTESUR NEGATIVE 09/04/2013 1401    Urine microscopic:  Recent Labs  09/04/13 1401  EPIU RARE  WBCU 0-2  RBCU 0-2  BACTERIA FEW*  OTHERU MUCOUS PRESENT      Imaging results:  Dg Chest 2 View  09/04/2013   CLINICAL DATA:  Altered mental status  EXAM: CHEST  2 VIEW  COMPARISON:  September 01, 2013  FINDINGS: There is no edema or consolidation. Heart size and pulmonary vascularity are normal. No adenopathy. No bone lesions.  IMPRESSION: No edema or consolidation.   Electronically Signed   By: Bretta Bang M.D.   On: 09/04/2013 11:57   Ct Head Wo Contrast  09/04/2013   CLINICAL DATA:  Confusion.  Dizziness.  EXAM: CT HEAD WITHOUT CONTRAST  TECHNIQUE: Contiguous axial images were obtained from the base of the skull through the vertex without intravenous contrast.  COMPARISON:  None.  FINDINGS: Ventricles, cisterns and other CSF spaces are within normal. There is no mass, mass effect, shift of midline structures or acute hemorrhage. There is no evidence to suggest acute infarction. 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 Minimal bilateral basal ganglia calcifications. There is opacification over the ethmoid sinus with mucosal membrane thickening involving the left maxillary sinus and a very subtle air-fluid  level over the deep and right maxillary sinus.  IMPRESSION: No acute intracranial findings.  Subtle chronic ischemic microvascular disease.  Sinus inflammatory disease most prominent over the left maxillary sinus.   Electronically Signed   By: Elberta Fortis M.D.   On: 09/04/2013 15:37    Other results: EKG: Sinus rhythm; nonspecific IVCD with LAD; left ventricular hypertrophy; no previous tracing  Assessment & Plan by Problem:  1.  Probable influenza.  Patient presents with influenza-like symptoms of about one week duration; despite his negative influenza panel, the plan is to treat presumptively with Tamiflu given high clinical suspicion of influenza.  Since he has bibasilar crackles on lung exam today, will repeat PA and lateral chest x-ray to rule out pneumonia.  Will also continue IV fluid and supportive care.  2.  Voume depletion.  Plan is IV normal saline volume replacement.  3.  Altered mental status due to problem #1.  Patient's mental status today is clear; he is afebrile without signs or symptoms of meningitis.  Plan is treat acute illness as above; supportive care.  4.  Other problems and plans as per the resident physician's note.

## 2013-09-06 LAB — BASIC METABOLIC PANEL
BUN: 8 mg/dL (ref 6–23)
CHLORIDE: 99 meq/L (ref 96–112)
CO2: 31 mEq/L (ref 19–32)
Calcium: 8.6 mg/dL (ref 8.4–10.5)
Creatinine, Ser: 0.73 mg/dL (ref 0.50–1.35)
GFR calc non Af Amer: 89 mL/min — ABNORMAL LOW (ref >90)
Glucose, Bld: 98 mg/dL (ref 70–99)
POTASSIUM: 3.8 meq/L (ref 3.7–5.3)
SODIUM: 142 meq/L (ref 137–147)

## 2013-09-06 LAB — CBC WITH DIFFERENTIAL/PLATELET
BASOS ABS: 0 10*3/uL (ref 0.0–0.1)
Basophils Relative: 0 % (ref 0–1)
Eosinophils Absolute: 0 10*3/uL (ref 0.0–0.7)
Eosinophils Relative: 0 % (ref 0–5)
HEMATOCRIT: 34.2 % — AB (ref 39.0–52.0)
HEMOGLOBIN: 12.5 g/dL — AB (ref 13.0–17.0)
LYMPHS ABS: 1.1 10*3/uL (ref 0.7–4.0)
Lymphocytes Relative: 18 % (ref 12–46)
MCH: 33.2 pg (ref 26.0–34.0)
MCHC: 36.5 g/dL — ABNORMAL HIGH (ref 30.0–36.0)
MCV: 91 fL (ref 78.0–100.0)
MONO ABS: 0.6 10*3/uL (ref 0.1–1.0)
Monocytes Relative: 11 % (ref 3–12)
NEUTROS ABS: 4.1 10*3/uL (ref 1.7–7.7)
Neutrophils Relative %: 71 % (ref 43–77)
Platelets: 125 10*3/uL — ABNORMAL LOW (ref 150–400)
RBC: 3.76 MIL/uL — AB (ref 4.22–5.81)
RDW: 12.8 % (ref 11.5–15.5)
WBC: 5.9 10*3/uL (ref 4.0–10.5)

## 2013-09-06 LAB — PHOSPHORUS: Phosphorus: 2.2 mg/dL — ABNORMAL LOW (ref 2.3–4.6)

## 2013-09-06 LAB — COMPREHENSIVE METABOLIC PANEL
ALT: 49 U/L (ref 0–53)
AST: 45 U/L — ABNORMAL HIGH (ref 0–37)
Albumin: 2.8 g/dL — ABNORMAL LOW (ref 3.5–5.2)
Alkaline Phosphatase: 52 U/L (ref 39–117)
BUN: 6 mg/dL (ref 6–23)
CALCIUM: 8.3 mg/dL — AB (ref 8.4–10.5)
CO2: 29 mEq/L (ref 19–32)
CREATININE: 0.61 mg/dL (ref 0.50–1.35)
Chloride: 98 mEq/L (ref 96–112)
GFR calc non Af Amer: >90 mL/min (ref >90)
Glucose, Bld: 107 mg/dL — ABNORMAL HIGH (ref 70–99)
Potassium: 2.9 mEq/L — CL (ref 3.7–5.3)
Sodium: 140 mEq/L (ref 137–147)
TOTAL PROTEIN: 6.3 g/dL (ref 6.0–8.3)
Total Bilirubin: 0.6 mg/dL (ref 0.3–1.2)

## 2013-09-06 MED ORDER — DEXTROSE 5 % IV SOLN
500.0000 mg | INTRAVENOUS | Status: DC
  Administered 2013-09-06 – 2013-09-07 (×2): 500 mg via INTRAVENOUS
  Filled 2013-09-06 (×2): qty 500

## 2013-09-06 MED ORDER — POTASSIUM CHLORIDE CRYS ER 20 MEQ PO TBCR
40.0000 meq | EXTENDED_RELEASE_TABLET | ORAL | Status: AC
  Administered 2013-09-06 (×2): 40 meq via ORAL
  Filled 2013-09-06 (×2): qty 2

## 2013-09-06 MED ORDER — AZITHROMYCIN 500 MG IV SOLR
500.0000 mg | INTRAVENOUS | Status: DC
  Filled 2013-09-06: qty 500

## 2013-09-06 NOTE — Progress Notes (Signed)
Subjective:  Patient had worsened SOB last night when his oxygen became disconnected. This was at rest. The patients mental status is approaching baseline. He continues to have chills and sweats.   Objective: Vital signs in last 24 hours: Filed Vitals:   09/05/13 1334 09/05/13 1811 09/05/13 2031 09/06/13 0532  BP: 157/81 147/80 147/75 146/75  Pulse: 51  53 52  Temp: 97.6 F (36.4 C)  97.8 F (36.6 C) 97.8 F (36.6 C)  TempSrc: Oral  Oral Oral  Resp: 18  24 16   Height:      Weight:      SpO2: 97%  95% 96%   Weight change:   Intake/Output Summary (Last 24 hours) at 09/06/13 1143 Last data filed at 09/05/13 1900  Gross per 24 hour  Intake 2861.67 ml  Output      0 ml  Net 2861.67 ml   Physical Exam  Constitutional: He is oriented to person, place, and time. He appears well-developed and well-nourished. No distress.  HENT:  Head: Normocephalic.  Mouth/Throat: Oropharynx is clear and moist. No oropharyngeal exudate.  Cardiovascular: Normal rate, regular rhythm, normal heart sounds and intact distal pulses.  Exam reveals no friction rub.   No murmur heard. Pulmonary/Chest: Effort normal. No respiratory distress. He has no wheezes. He has rales.  Abdominal: Soft. Bowel sounds are normal. He exhibits no distension. There is no tenderness.  Neurological: He is alert and oriented to person, place, and time.  Skin: He is not diaphoretic.  Psychiatric: He has a normal mood and affect. His behavior is normal.     Lab Results: Basic Metabolic Panel:  Recent Labs Lab 09/04/13 1056 09/04/13 1644 09/05/13 0426 09/06/13 0600  NA 135*  --  143 140  K 2.9*  --  4.1 2.9*  CL 91*  --  104 98  CO2 29  --  28 29  GLUCOSE 115*  --  109* 107*  BUN 13  --  10 6  CREATININE 0.86  --  0.76 0.61  CALCIUM 8.6  --  8.0* 8.3*  MG  --  1.8  --   --   PHOS  --   --   --  2.2*   Liver Function Tests:  Recent Labs Lab 09/04/13 1056 09/06/13 0600  AST 29 45*  ALT 20 49  ALKPHOS  42 52  BILITOT 0.5 0.6  PROT 7.0 6.3  ALBUMIN 3.2* 2.8*   CBC:  Recent Labs Lab 09/05/13 0426 09/06/13 1114  WBC 5.2 5.9  NEUTROABS  --  4.1  HGB 12.6* 12.5*  HCT 36.2* 34.2*  MCV 93.1 91.0  PLT 111* 125*   Cardiac Enzymes:  Recent Labs Lab 09/04/13 1514 09/05/13 0426  CKTOTAL 252* 260*   CBG:  Recent Labs Lab 09/04/13 1050  GLUCAP 134*   Urinalysis:  Recent Labs Lab 09/04/13 1401  COLORURINE AMBER*  LABSPEC 1.021  PHURINE 6.0  GLUCOSEU NEGATIVE  HGBUR SMALL*  BILIRUBINUR SMALL*  KETONESUR 15*  PROTEINUR NEGATIVE  UROBILINOGEN 1.0  NITRITE NEGATIVE  LEUKOCYTESUR NEGATIVE    Micro Results: Recent Results (from the past 240 hour(s))  CULTURE, GROUP A STREP     Status: None   Collection Time    09/01/13 10:00 AM      Result Value Ref Range Status   Specimen Description THROAT   Final   Special Requests NONE   Final   Culture     Final   Value: No Beta Hemolytic Streptococci Isolated  Performed at [facility]   Report Status 09/03/2013 FINAL   Final  CULTURE, BLOOD (ROUTINE X 2)     Status: None   Collection Time    09/05/13  2:30 PM      Result Value Ref Range Status   Specimen Description BLOOD LEFT ARM   Final   Special Requests BOTTLES DRAWN AEROBIC ONLY 10CC   Final   Culture  Setup Time     Final   Value: 09/05/2013 22:31     Performed at [facility]   Culture     Final   Value:        BLOOD CULTURE RECEIVED NO GROWTH TO DATE CULTURE WILL BE HELD FOR 5 DAYS BEFORE ISSUING A FINAL NEGATIVE REPORT     Performed at [facility]   Report Status PENDING   Incomplete  CULTURE, BLOOD (ROUTINE X 2)     Status: None   Collection Time    09/05/13  2:31 PM      Result Value Ref Range Status   Specimen Description BLOOD RIGHT HAND   Final   Special Requests BOTTLES DRAWN AEROBIC ONLY 1CC   Final   Culture  Setup Time     Final   Value: 09/05/2013 22:31     Performed at [facility]   Culture     Final    Value:        BLOOD CULTURE RECEIVED NO GROWTH TO DATE CULTURE WILL BE HELD FOR 5 DAYS BEFORE ISSUING A FINAL NEGATIVE REPORT     Performed at [facility]   Report Status PENDING   Incomplete   Studies/Results: Dg Chest 2 View  09/05/2013   CLINICAL DATA:  cough  EXAM: CHEST  2 VIEW  COMPARISON:  DG CHEST 2 VIEW dated 09/04/2013  FINDINGS: Low lung volumes. Cardiac silhouette stent normal limits. Atherosclerotic calcifications are identified within the aorta. There is diffuse thickening of the wrist is tissue markings. There is no significant peribronchial cuffing. No focal regions of consolidation appreciated. The osseous structures unremarkable.  IMPRESSION: Diffuse interstitial infiltrate. Differential considerations include infectious or inflammatory etiologies. Noncardiogenic pulmonary edema is also diagnostic consideration. Considering the relative acuity of these findings the etiologies such as allergic pneumonitis or inhalational pneumonitis if clinically appropriate cannot be excluded. Surveillance evaluation recommended status post appropriate therapeutic management.   Electronically Signed   By: Salome Holmes M.D.   On: 09/05/2013 12:27   Ct Head Wo Contrast  09/04/2013   CLINICAL DATA:  Confusion.  Dizziness.  EXAM: CT HEAD WITHOUT CONTRAST  TECHNIQUE: Contiguous axial images were obtained from the base of the skull through the vertex without intravenous contrast.  COMPARISON:  None.  FINDINGS: Ventricles, cisterns and other CSF spaces are within normal. There is no mass, mass effect, shift of midline structures or acute hemorrhage. There is no evidence to suggest acute infarction. 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 Minimal bilateral basal ganglia calcifications. There is opacification over the ethmoid sinus with mucosal membrane thickening involving the left maxillary sinus and a very subtle air-fluid level over the deep and right maxillary sinus.  IMPRESSION: No acute  intracranial findings.  Subtle chronic ischemic microvascular disease.  Sinus inflammatory disease most prominent over the left maxillary sinus.   Electronically Signed   By: Elberta Fortis M.D.   On: 09/04/2013 15:37   Medications: I have reviewed the patient's current medications. Scheduled Meds: . aspirin EC  81 mg Oral Daily  .  azithromycin  500 mg Intravenous Q24H  . heparin  5,000 Units Subcutaneous 3 times per day  . oseltamivir  75 mg Oral BID  . potassium chloride  40 mEq Oral Q4H  . sodium chloride  3 mL Intravenous Q12H   Continuous Infusions: . sodium chloride 50 mL/hr at 09/06/13 0638   PRN Meds:.acetaminophen, acetaminophen, benzonatate, guaiFENesin-dextromethorphan, ondansetron (ZOFRAN) IV, ondansetron Assessment/Plan: Principal Problem:   Atypical pneumonia Active Problems:   Hypokalemia   Dehydration   Diarrhea  URI  The patient likely has influenza complicated by anorexia and diarrhea. The patient may also have an atypical PNA as repeat CXR after rehydration showed a possible mild diffuse intersititial infiltrate. It is also possible that this was pulmonary edema in setting of aggresive rehydration on presentation. - Tamiflu  - Start IV azithromycin (not tolerating PO) - IVFs at 50/hr until tolerating PO - Robitussin DM prn cough  - Ambulate patient on pulse oxygen w/ and w/o oxygen. - Repeat blood culture prior to ABX - Repeat CBC with diff  Hypokalemia  Likely due to GI loss, lasix diuresis, and decreased PO intake. 2.9 this am. - Give 40 meQ x 2 q 4 hour and repeat BMP tonight.  Dehydration - resolved The patient's dehydration is likely due to GI loss, decreased PO intake and ongoing diuretic use.  - Hold diuretic  - Continue IVFs at 50 cc/hr until tolerating food and water.  Diarrhea  Appears resolved. - Holding home colace.  Vertigo  Patient takes triamterene-HCTZ for vertigo.  - Hold for now in setting of decreased PO intake   Diet: Regular   DVT: Heparin sq   Dispo: Disposition is deferred at this time, awaiting improvement of current medical problems.  Anticipated discharge in approximately 1 day(s).   The patient does have a current PCP (No Pcp Per Patient) and [facility] after discharge.  The patient does not have transportation limitations that hinder transportation to clinic appointments.  .Services Needed at time of discharge: Y = Yes, Blank = No PT:   OT:   RN:   Equipment:   Other:     LOS: 2 days   Pleas Kochhristopher Fatema Rabe, MD 09/06/2013, 11:43 AM

## 2013-09-06 NOTE — Progress Notes (Signed)
Internal Medicine Attending  Date: 09/06/2013  Patient name: Francisco LlanoJohn A Dominguez Medical record number: [id number] Date of birth: Mar 18, 1941 Age: 73 y.o. Gender: male  I saw and evaluated the patient, and discussed his care on A.M rounds with housestaff.  I reviewed the resident's note by Dr. Glendell DockerKomanski and I agree with the resident's findings and plans as documented in his note, with the following additional comments.  Chest x-ray done yesterday 09/06/2011 showed diffuse interstitial infiltrate; given lack of symptomatic improvement, agree with plan to add IV azithromycin for atypical coverage including Mycoplasma.

## 2013-09-06 NOTE — Progress Notes (Signed)
Patient ambulated without oxygen at a brisk pace.  Stated he "felt better" out of his room.  O2  sats ranging 94-98 percent.  No dyspnea noted.  O2 sats are 98% at rest on R/A/

## 2013-09-07 DIAGNOSIS — J189 Pneumonia, unspecified organism: Secondary | ICD-10-CM

## 2013-09-07 LAB — BASIC METABOLIC PANEL
BUN: 9 mg/dL (ref 6–23)
CHLORIDE: 99 meq/L (ref 96–112)
CO2: 30 meq/L (ref 19–32)
Calcium: 8.6 mg/dL (ref 8.4–10.5)
Creatinine, Ser: 0.77 mg/dL (ref 0.50–1.35)
GFR calc Af Amer: >90 mL/min (ref >90)
GFR calc non Af Amer: 87 mL/min — ABNORMAL LOW (ref >90)
Glucose, Bld: 120 mg/dL — ABNORMAL HIGH (ref 70–99)
Potassium: 3.4 mEq/L — ABNORMAL LOW (ref 3.7–5.3)
Sodium: 139 mEq/L (ref 137–147)

## 2013-09-07 MED ORDER — AZITHROMYCIN 250 MG PO TABS: ORAL_TABLET | ORAL | Status: AC

## 2013-09-07 MED ORDER — OSELTAMIVIR PHOSPHATE 75 MG PO CAPS: 75.0000 mg | ORAL_CAPSULE | Freq: Two times a day (BID) | ORAL | Status: DC

## 2013-09-07 MED ORDER — GUAIFENESIN-DM 100-10 MG/5ML PO SYRP: 5.0000 mL | ORAL_SOLUTION | ORAL | Status: DC | PRN

## 2013-09-07 MED ORDER — ONDANSETRON HCL 4 MG PO TABS: 4.0000 mg | ORAL_TABLET | Freq: Four times a day (QID) | ORAL | Status: DC | PRN

## 2013-09-07 NOTE — Care Management Note (Signed)
    Page 1 of 1   09/07/2013     3:37:47 PM   CARE MANAGEMENT NOTE 09/07/2013  Patient:  Francisco Dominguez,Francisco Dominguez   Account Number:  [id number]  Date Initiated:  09/07/2013  Documentation initiated by:  Letha CapeAYLOR,[place] Brigance  Subjective/Objective Assessment:   dx uri  admit-from home     Action/Plan:   Anticipated DC Date:  09/07/2013   Anticipated DC Plan:  HOME/SELF CARE      DC Planning Services  CM consult      Choice offered to / List presented to:             Status of service:  Completed, signed off Medicare Important Message given?   (If response is "NO", the following Medicare IM given date fields will be blank) Date Medicare IM given:   Date Additional Medicare IM given:    Discharge Disposition:  HOME/SELF CARE  Per UR Regulation:  Reviewed for med. necessity/level of care/duration of stay  If discussed at Long Length of Stay Meetings, dates discussed:    Comments:

## 2013-09-07 NOTE — Discharge Summary (Signed)
Name: Francisco Dominguez MRN: [id number] DOB: 04-08-1941 73 y.o. PCP: No Pcp Per Patient  Date of Admission: 09/04/2013 10:29 AM Date of Discharge: 09/07/2013 Attending Physician: Farley LyJerry Dale Joines, MD  Discharge Diagnosis:  Principal Problem:   Atypical pneumonia Active Problems:   Hypokalemia   Dehydration   Diarrhea  Discharge Medications:   Medication List    STOP taking these medications       docusate sodium 100 MG capsule  Commonly known as:  COLACE     NYQUIL PO      TAKE these medications       acetaminophen 500 MG tablet  Commonly known as:  TYLENOL  Take 1,000 mg by mouth every 6 (six) hours as needed for mild pain.     aspirin EC 81 MG tablet  Take 81 mg by mouth daily.     azithromycin 250 MG tablet  Commonly known as:  ZITHROMAX  Take one tablet daily for 6 days.     benzonatate 100 MG capsule  Commonly known as:  TESSALON  Take 1 capsule (100 mg total) by mouth 3 (three) times daily as needed for cough.     guaiFENesin-dextromethorphan 100-10 MG/5ML syrup  Commonly known as:  ROBITUSSIN DM  Take 5 mLs by mouth every 4 (four) hours as needed for cough.     ipratropium 0.06 % nasal spray  Commonly known as:  ATROVENT  Place 2 sprays into both nostrils 4 (four) times daily.     ondansetron 4 MG tablet  Commonly known as:  ZOFRAN  Take 1 tablet (4 mg total) by mouth every 6 (six) hours as needed for nausea.     oseltamivir 75 MG capsule  Commonly known as:  TAMIFLU  Take 1 capsule (75 mg total) by mouth 2 (two) times daily.     triamterene-hydrochlorothiazide 37.5-25 MG per capsule  Commonly known as:  DYAZIDE  Take 1 capsule by mouth daily.        Disposition and follow-up:   Mr.Francisco A Madaline Brilliantbad [facility] in Stable condition.  At the hospital follow up visit please address:  1.  Atypical PNA, hypokalemia, URI  2.  Labs / imaging needed at time of follow-up: F/U CXR in 4-6 weeks to ensure resolution,  BMP  3.  Pending labs/ test needing follow-up: Blood cultures  Follow-up Appointments:     Follow-up Information   Follow up with Genella MechKOLLAR, ELIZABETH, MD On 09/12/2013. (3:45 pm)    Specialty:  Internal Medicine   Contact information:   [address] [place] [address] [phone]       Discharge Instructions:  Future Appointments Provider Department Dept Phone   09/12/2013 3:45 PM Judie BonusElizabeth A Kollar, MD [facility] [phone]      Consultations:  None.  Procedures Performed:  Dg Chest 2 View  09/05/2013   CLINICAL DATA:  cough  EXAM: CHEST  2 VIEW  COMPARISON:  DG CHEST 2 VIEW dated 09/04/2013  FINDINGS: Low lung volumes. Cardiac silhouette stent normal limits. Atherosclerotic calcifications are identified within the aorta. There is diffuse thickening of the wrist is tissue markings. There is no significant peribronchial cuffing. No focal regions of consolidation appreciated. The osseous structures unremarkable.  IMPRESSION: Diffuse interstitial infiltrate. Differential considerations include infectious or inflammatory etiologies. Noncardiogenic pulmonary edema is also diagnostic consideration. Considering the relative acuity of these findings the etiologies such as allergic pneumonitis or inhalational pneumonitis if clinically appropriate cannot  be excluded. Surveillance evaluation recommended status post appropriate therapeutic management.   Electronically Signed   By: Salome Holmes M.D.   On: 09/05/2013 12:27   Dg Chest 2 View  09/04/2013   CLINICAL DATA:  Altered mental status  EXAM: CHEST  2 VIEW  COMPARISON:  September 01, 2013  FINDINGS: There is no edema or consolidation. Heart size and pulmonary vascularity are normal. No adenopathy. No bone lesions.  IMPRESSION: No edema or consolidation.   Electronically Signed   By: Bretta Bang M.D.   On: 09/04/2013 11:57   Dg Chest 2 View  09/01/2013   CLINICAL DATA:  Cough, sore through for 1 week  EXAM:  CHEST  2 VIEW  COMPARISON:  None.  FINDINGS: No active infiltrate or effusion is seen. Mediastinal contours appear normal. The heart is within upper limits of normal. There are degenerative changes in the mid to lower thoracic spine.  IMPRESSION: No active lung disease.   Electronically Signed   By: Dwyane Dee M.D.   On: 09/01/2013 10:52   Ct Head Wo Contrast  09/04/2013   CLINICAL DATA:  Confusion.  Dizziness.  EXAM: CT HEAD WITHOUT CONTRAST  TECHNIQUE: Contiguous axial images were obtained from the base of the skull through the vertex without intravenous contrast.  COMPARISON:  None.  FINDINGS: Ventricles, cisterns and other CSF spaces are within normal. There is no mass, mass effect, shift of midline structures or acute hemorrhage. There is no evidence to suggest acute infarction. 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 Minimal bilateral basal ganglia calcifications. There is opacification over the ethmoid sinus with mucosal membrane thickening involving the left maxillary sinus and a very subtle air-fluid level over the deep and right maxillary sinus.  IMPRESSION: No acute intracranial findings.  Subtle chronic ischemic microvascular disease.  Sinus inflammatory disease most prominent over the left maxillary sinus.   Electronically Signed   By: Elberta Fortis M.D.   On: 09/04/2013 15:37    Admission HPI: Francisco Dominguez is a 73 y.o. man who presents with his daughter with a cc of AMS. He was on vacation from [place] when he developed chills, cough, myalgias, and rhinorrhea over the last week. He as seen in urgent care on 2/27 for this complain and was diagnosed with URI and was given tessalon. However, he failed to improve and developed diarrhea. He also complained of anorexia over the last 48 hours. He became increasingly weak and was having trouble sleeping. Last night he took NyQuil and continued to have trouble sleeping. When his daughter spoke with him this morning he was altered. Throughout  the day he has had trouble providing accurate history, using the cell phone, and paying attention to conversations.   Hospital Course by problem list: Principal Problem:   Atypical pneumonia Active Problems:   Hypokalemia   Dehydration   Diarrhea   Atypical PNA and URI  Initially, the patient was felt to have a viral URI, possibly influenza, and received tamiflu and IVFs. The patient failed to improve and subsequent CXR revealed possible interstitial infiltrates. At that time, the patients symptoms were felt to be 2/2 atypical PNA . It is also possible that this was pulmonary edema in setting of viral URI and subsequent aggresive rehydration . Patient is ambulating w/o oxygen with no problems at the time of discharge. The patient was given a full treatment of tamiflu and was prescribed 8 days of azithromycin.   Hypokalemia  Likely due to GI loss, lasix diuresis, and  decreased PO intake. Resolved by the time of discharge.  Headache  Likely tension headache. Tylenol prn.   Diarrhea  Likely due to viral illness. Resolved on day 2 of admission.    Vertigo  Patient takes triamterene-HCTZ for vertigo. This was held during the admission. It was restarted on discharge.  Discharge Vitals:   BP 137/57  Pulse 50  Temp(Src) 98.6 F (37 C) (Oral)  Resp 16  Ht 5\' 5"  (1.651 m)  Wt 150 lb (68.04 kg)  BMI 24.96 kg/m2  SpO2 94%  Discharge Labs:  Results for orders placed during the hospital encounter of 09/04/13 (from the past 24 hour(s))  BASIC METABOLIC PANEL     Status: Abnormal   Collection Time    09/06/13  6:00 PM      Result Value Ref Range   Sodium 142  137 - 147 mEq/L   Potassium 3.8  3.7 - 5.3 mEq/L   Chloride 99  96 - 112 mEq/L   CO2 31  19 - 32 mEq/L   Glucose, Bld 98  70 - 99 mg/dL   BUN 8  6 - 23 mg/dL   Creatinine, Ser 1.61  0.50 - 1.35 mg/dL   Calcium 8.6  8.4 - 09.6 mg/dL   GFR calc non Af Amer 89 (*) >90 mL/min   GFR calc Af Amer >90  >90 mL/min  BASIC METABOLIC  PANEL     Status: Abnormal   Collection Time    09/07/13 10:08 AM      Result Value Ref Range   Sodium 139  137 - 147 mEq/L   Potassium 3.4 (*) 3.7 - 5.3 mEq/L   Chloride 99  96 - 112 mEq/L   CO2 30  19 - 32 mEq/L   Glucose, Bld 120 (*) 70 - 99 mg/dL   BUN 9  6 - 23 mg/dL   Creatinine, Ser 0.45  0.50 - 1.35 mg/dL   Calcium 8.6  8.4 - 40.9 mg/dL   GFR calc non Af Amer 87 (*) >90 mL/min   GFR calc Af Amer >90  >90 mL/min    Signed: Pleas Koch, MD 09/07/2013, 12:56 PM   Time Spent on Discharge: 35 minutes Services Ordered on Discharge: None Equipment Ordered on Discharge: None

## 2013-09-07 NOTE — Progress Notes (Addendum)
Subjective:  Patient ambulated w/o oxygen w/o problems. His SOB is much improved after started azithromycin. He was able to eat dinner and is drinking w/o problems. He also complains of mild heache. It occurs when he coughs and is located as ring around the top of his head.  Objective: Vital signs in last 24 hours: Filed Vitals:   09/06/13 1404 09/06/13 1801 09/06/13 2039 09/07/13 0600  BP: 114/63  137/74 137/57  Pulse: 52  47 50  Temp: 98.1 F (36.7 C)  98.5 F (36.9 C) 98.6 F (37 C)  TempSrc: Oral  Oral Oral  Resp: 18  16 16   Height:      Weight:      SpO2: 96% 97% 97% 94%   Weight change:   Intake/Output Summary (Last 24 hours) at 09/07/13 0906 Last data filed at 09/07/13 0600  Gross per 24 hour  Intake   1610 ml  Output      0 ml  Net   1610 ml   Physical Exam  Constitutional: He is oriented to person, place, and time. He appears well-developed and well-nourished. No distress.  HENT:  Head: Normocephalic.  Mouth/Throat: Oropharynx is clear and moist. No oropharyngeal exudate.  Cardiovascular: Normal rate, regular rhythm, normal heart sounds and intact distal pulses.  Exam reveals no friction rub.   No murmur heard. Pulmonary/Chest: Effort normal. No respiratory distress. He has no wheezes. He has no rales.  Abdominal: Soft. Bowel sounds are normal. He exhibits no distension. There is no tenderness.  Neurological: He is alert and oriented to person, place, and time.  Skin: He is not diaphoretic.  Psychiatric: He has a normal mood and affect. His behavior is normal.     Lab Results: Basic Metabolic Panel:  Recent Labs Lab 09/04/13 1056 09/04/13 1644 09/05/13 0426 09/06/13 0600 09/06/13 1800  NA 135*  --  143 140 142  K 2.9*  --  4.1 2.9* 3.8  CL 91*  --  104 98 99  CO2 29  --  28 29 31   GLUCOSE 115*  --  109* 107* 98  BUN 13  --  10 6 8   CREATININE 0.86  --  0.76 0.61 0.73  CALCIUM 8.6  --  8.0* 8.3* 8.6  MG  --  1.8  --   --   --   PHOS  --   --    --  2.2*  --    Liver Function Tests:  Recent Labs Lab 09/04/13 1056 09/06/13 0600  AST 29 45*  ALT 20 49  ALKPHOS 42 52  BILITOT 0.5 0.6  PROT 7.0 6.3  ALBUMIN 3.2* 2.8*   CBC:  Recent Labs Lab 09/05/13 0426 09/06/13 1114  WBC 5.2 5.9  NEUTROABS  --  4.1  HGB 12.6* 12.5*  HCT 36.2* 34.2*  MCV 93.1 91.0  PLT 111* 125*   Cardiac Enzymes:  Recent Labs Lab 09/04/13 1514 09/05/13 0426  CKTOTAL 252* 260*   CBG:  Recent Labs Lab 09/04/13 1050  GLUCAP 134*   Urinalysis:  Recent Labs Lab 09/04/13 1401  COLORURINE AMBER*  LABSPEC 1.021  PHURINE 6.0  GLUCOSEU NEGATIVE  HGBUR SMALL*  BILIRUBINUR SMALL*  KETONESUR 15*  PROTEINUR NEGATIVE  UROBILINOGEN 1.0  NITRITE NEGATIVE  LEUKOCYTESUR NEGATIVE    Micro Results: Recent Results (from the past 240 hour(s))  CULTURE, GROUP A STREP     Status: None   Collection Time    09/01/13 10:00 AM  Result Value Ref Range Status   Specimen Description THROAT   Final   Special Requests NONE   Final   Culture     Final   Value: No Beta Hemolytic Streptococci Isolated     Performed at [facility]   Report Status 09/03/2013 FINAL   Final  CULTURE, BLOOD (ROUTINE X 2)     Status: None   Collection Time    09/05/13  2:30 PM      Result Value Ref Range Status   Specimen Description BLOOD LEFT ARM   Final   Special Requests BOTTLES DRAWN AEROBIC ONLY 10CC   Final   Culture  Setup Time     Final   Value: 09/05/2013 22:31     Performed at [facility]   Culture     Final   Value:        BLOOD CULTURE RECEIVED NO GROWTH TO DATE CULTURE WILL BE HELD FOR 5 DAYS BEFORE ISSUING A FINAL NEGATIVE REPORT     Performed at [facility]   Report Status PENDING   Incomplete  CULTURE, BLOOD (ROUTINE X 2)     Status: None   Collection Time    09/05/13  2:31 PM      Result Value Ref Range Status   Specimen Description BLOOD RIGHT HAND   Final   Special Requests BOTTLES DRAWN AEROBIC ONLY 1CC    Final   Culture  Setup Time     Final   Value: 09/05/2013 22:31     Performed at [facility]   Culture     Final   Value:        BLOOD CULTURE RECEIVED NO GROWTH TO DATE CULTURE WILL BE HELD FOR 5 DAYS BEFORE ISSUING A FINAL NEGATIVE REPORT     Performed at [facility]   Report Status PENDING   Incomplete  CULTURE, BLOOD (ROUTINE X 2)     Status: None   Collection Time    09/06/13 11:10 AM      Result Value Ref Range Status   Specimen Description BLOOD LEFT ANTECUBITAL   Final   Special Requests BOTTLES DRAWN AEROBIC AND ANAEROBIC 10CC   Final   Culture  Setup Time     Final   Value: 09/06/2013 16:05     Performed at [facility]   Culture     Final   Value:        BLOOD CULTURE RECEIVED NO GROWTH TO DATE CULTURE WILL BE HELD FOR 5 DAYS BEFORE ISSUING A FINAL NEGATIVE REPORT     Performed at [facility]   Report Status PENDING   Incomplete  CULTURE, BLOOD (ROUTINE X 2)     Status: None   Collection Time    09/06/13 11:14 AM      Result Value Ref Range Status   Specimen Description BLOOD RIGHT HAND   Final   Special Requests     Final   Value: BOTTLES DRAWN AEROBIC AND ANAEROBIC BLUE 10CC RED 9CC   Culture  Setup Time     Final   Value: 09/06/2013 16:05     Performed at [facility]   Culture     Final   Value:        BLOOD CULTURE RECEIVED NO GROWTH TO DATE CULTURE WILL BE HELD FOR 5 DAYS BEFORE ISSUING A FINAL NEGATIVE REPORT     Performed at [facility]   Report Status  PENDING   Incomplete   Studies/Results: Dg Chest 2 View  09/05/2013   CLINICAL DATA:  cough  EXAM: CHEST  2 VIEW  COMPARISON:  DG CHEST 2 VIEW dated 09/04/2013  FINDINGS: Low lung volumes. Cardiac silhouette stent normal limits. Atherosclerotic calcifications are identified within the aorta. There is diffuse thickening of the wrist is tissue markings. There is no significant peribronchial cuffing. No focal regions of consolidation appreciated. The osseous  structures unremarkable.  IMPRESSION: Diffuse interstitial infiltrate. Differential considerations include infectious or inflammatory etiologies. Noncardiogenic pulmonary edema is also diagnostic consideration. Considering the relative acuity of these findings the etiologies such as allergic pneumonitis or inhalational pneumonitis if clinically appropriate cannot be excluded. Surveillance evaluation recommended status post appropriate therapeutic management.   Electronically Signed   By: Salome Holmes M.D.   On: 09/05/2013 12:27   Medications: I have reviewed the patient's current medications. Scheduled Meds: . aspirin EC  81 mg Oral Daily  . azithromycin  500 mg Intravenous Q24H  . heparin  5,000 Units Subcutaneous 3 times per day  . oseltamivir  75 mg Oral BID  . sodium chloride  3 mL Intravenous Q12H   Continuous Infusions:   PRN Meds:.acetaminophen, acetaminophen, benzonatate, guaiFENesin-dextromethorphan, ondansetron (ZOFRAN) IV, ondansetron Assessment/Plan: Principal Problem:   Atypical pneumonia Active Problems:   Hypokalemia   Dehydration   Diarrhea  Atypical PNA and URI The patient likely has an atypical PNA complicated by anorexia and diarrhea. It is also possible that this was pulmonary edema in setting of aggresive rehydration and viral URI. Patient is ambulating w/o oxygen with no problems at this time. - cont tamiflu - Transition to PO abx after morning dose. - d/c IVFs as patient tolerating PO - Robitussin DM prn cough   Hypokalemia  Likely due to GI loss, lasix diuresis, and decreased PO intake. Resolved.  Headache Likely tension headache. Tylenol prn.  Diarrhea  Appears resolved. - Holding home colace.  Vertigo  Patient takes triamterene-HCTZ for vertigo.  - restart on d/c  Diet: Regular  DVT: Heparin sq   Dispo: Disposition is deferred at this time, awaiting improvement of current medical problems.  Anticipated discharge in approximately 1 day(s).    The patient does have a current PCP (No Pcp Per Patient) and does not need an [facility] hospital follow-up appointment after discharge.  The patient does not have transportation limitations that hinder transportation to clinic appointments.  .Services Needed at time of discharge: Y = Yes, Blank = No PT:   OT:   RN:   Equipment:   Other:     LOS: 3 days   Pleas Koch, MD 09/07/2013, 9:06 AM

## 2013-09-07 NOTE — Discharge Instructions (Signed)
Upper Respiratory Infection, Adult  An upper respiratory infection (URI) is also known as the common cold. It is often caused by a type of germ (virus). Colds are easily spread (contagious). You can pass it to others by kissing, coughing, sneezing, or drinking out of the same glass. Usually, you get better in 1 or 2 weeks.   HOME CARE    Only take medicine as told by your doctor.   Use a warm mist humidifier or breathe in steam from a hot shower.   Drink enough water and fluids to keep your pee (urine) clear or pale yellow.   Get plenty of rest.   Return to work when your temperature is back to normal or as told by your doctor. You may use a face mask and wash your hands to stop your cold from spreading.  GET HELP RIGHT AWAY IF:    After the first few days, you feel you are getting worse.   You have questions about your medicine.   You have chills, shortness of breath, or brown or red spit (mucus).   You have yellow or brown snot (nasal discharge) or pain in the face, especially when you bend forward.   You have a fever, puffy (swollen) neck, pain when you swallow, or white spots in the back of your throat.   You have a bad headache, ear pain, sinus pain, or chest pain.   You have a high-pitched whistling sound when you breathe in and out (wheezing).   You have a lasting cough or cough up blood.   You have sore muscles or a stiff neck.  MAKE SURE YOU:    Understand these instructions.   Will watch your condition.   Will get help right away if you are not doing well or get worse.  Document Released: 12/09/2007 Document Revised: 09/14/2011 Document Reviewed: 10/27/2010  Exit[employer] Patient Information 2014 [employer]  Pneumonia, Adult  Pneumonia is an infection of the lungs.   CAUSES  Pneumonia may be caused by bacteria or a virus. Usually, these infections are caused by breathing infectious particles into the lungs (respiratory tract).  SYMPTOMS    Cough.   Fever.   Chest pain.   Increased  rate of breathing.   Wheezing.   Mucus production.  DIAGNOSIS   If you have the common symptoms of pneumonia, your caregiver will typically confirm the diagnosis with a chest X-ray. The X-ray will show an abnormality in the lung (pulmonary infiltrate) if you have pneumonia. Other tests of your blood, urine, or sputum may be done to find the specific cause of your pneumonia. Your caregiver may also do tests (blood gases or pulse oximetry) to see how well your lungs are working.  TREATMENT   Some forms of pneumonia may be spread to other people when you cough or sneeze. You may be asked to wear a mask before and during your exam. Pneumonia that is caused by bacteria is treated with antibiotic medicine. Pneumonia that is caused by the influenza virus may be treated with an antiviral medicine. Most other viral infections must run their course. These infections will not respond to antibiotics.   PREVENTION  A pneumococcal shot (vaccine) is available to prevent a common bacterial cause of pneumonia. This is usually suggested for:   People over 65 years old.   Patients on chemotherapy.   People with chronic lung problems, such as bronchitis or emphysema.   People with immune system problems.  If you are over   65 or have a high risk condition, you may receive the pneumococcal vaccine if you have not received it before. In some countries, a routine influenza vaccine is also recommended. This vaccine can help prevent some cases of pneumonia.You may be offered the influenza vaccine as part of your care.  If you smoke, it is time to quit. You may receive instructions on how to stop smoking. Your caregiver can provide medicines and counseling to help you quit.  HOME CARE INSTRUCTIONS    Cough suppressants may be used if you are losing too much rest. However, coughing protects you by clearing your lungs. You should avoid using cough suppressants if you can.   Your caregiver may have prescribed medicine if he or she thinks  your pneumonia is caused by a bacteria or influenza. Finish your medicine even if you start to feel better.   Your caregiver may also prescribe an expectorant. This loosens the mucus to be coughed up.   Only take over-the-counter or prescription medicines for pain, discomfort, or fever as directed by your caregiver.   Do not smoke. Smoking is a common cause of bronchitis and can contribute to pneumonia. If you are a smoker and continue to smoke, your cough may last several weeks after your pneumonia has cleared.   A cold steam vaporizer or humidifier in your room or home may help loosen mucus.   Coughing is often worse at night. Sleeping in a semi-upright position in a recliner or using a couple pillows under your head will help with this.   Get rest as you feel it is needed. Your body will usually let you know when you need to rest.  SEEK IMMEDIATE MEDICAL CARE IF:    Your illness becomes worse. This is especially true if you are elderly or weakened from any other disease.   You cannot control your cough with suppressants and are losing sleep.   You begin coughing up blood.   You develop pain which is getting worse or is uncontrolled with medicines.   You have a fever.   Any of the symptoms which initially brought you in for treatment are getting worse rather than better.   You develop shortness of breath or chest pain.  MAKE SURE YOU:    Understand these instructions.   Will watch your condition.   Will get help right away if you are not doing well or get worse.  Document Released: 06/22/2005 Document Revised: 09/14/2011 Document Reviewed: 09/11/2010  [employer] Patient Information 2014 [employer]

## 2013-09-07 NOTE — Progress Notes (Signed)
Patient was discharged home by MD order; discharged instructions  review and give to patient with care notes; IV DIC; skin intact; patient will be escorted to the car by nurse tech via wheelchair.  

## 2013-09-08 NOTE — ED Provider Notes (Signed)
Medical screening examination/treatment/procedure(s) were conducted as a shared visit with non-physician practitioner(s) and myself.  I personally evaluated the patient during the encounter   .Face to face Exam:  General:  A&Ox3 HEENT:  Atraumatic Resp:  Tachypnia Abd:  Nondistended Neuro:No focal deficits     Nelia Shiobert L Bricia Taher, MD 09/08/13 [phone]

## 2013-09-11 LAB — CULTURE, BLOOD (ROUTINE X 2): CULTURE: NO GROWTH

## 2013-09-12 ENCOUNTER — Ambulatory Visit (INDEPENDENT_AMBULATORY_CARE_PROVIDER_SITE_OTHER): Payer: BC Managed Care – PPO | Admitting: Internal Medicine

## 2013-09-12 ENCOUNTER — Encounter: Payer: Self-pay | Admitting: Internal Medicine

## 2013-09-12 VITALS — BP 156/76 | HR 62 | Temp 96.2°F | Ht 65.5 in | Wt 161.8 lb

## 2013-09-12 DIAGNOSIS — E876 Hypokalemia: Secondary | ICD-10-CM

## 2013-09-12 DIAGNOSIS — J189 Pneumonia, unspecified organism: Secondary | ICD-10-CM

## 2013-09-12 LAB — CULTURE, BLOOD (ROUTINE X 2)
CULTURE: NO GROWTH
CULTURE: NO GROWTH

## 2013-09-12 NOTE — Progress Notes (Signed)
Subjective:     Patient ID: Francisco Dominguez, male   DOB: [birth year], 73 y.o.   MRN: [id number]  HPI The patient is a 73 YO male who is coming in today for hospital follow up, he is originally from [place] and intends to return there for further care. He was in the hospital and diagnosed with pneumonia. He has had some loose stools with the antibiotics and has only 1 day left. He is feeling better with his lungs and breathing. He is not coughing anymore. He has not had any fevers or chills at home.   Review of Systems  Constitutional: Negative for fever, chills, diaphoresis, activity change, appetite change, fatigue and unexpected weight change.  Respiratory: Negative for cough, chest tightness, shortness of breath and wheezing.   Cardiovascular: Negative for chest pain, palpitations and leg swelling.  Gastrointestinal: Positive for diarrhea. Negative for nausea, vomiting, abdominal pain, constipation and abdominal distention.       Objective:   Physical Exam  Constitutional: He is oriented to person, place, and time. He appears well-developed and well-nourished.  HENT:  Head: Normocephalic and atraumatic.  Cardiovascular: Normal rate and regular rhythm.   Pulmonary/Chest: Effort normal and breath sounds normal. No respiratory distress. He has no wheezes. He has no rales. He exhibits no tenderness.  Abdominal: Soft. Bowel sounds are normal. He exhibits no distension. There is no tenderness. There is no rebound and no guarding.  Neurological: He is alert and oriented to person, place, and time. No cranial nerve deficit.       Assessment:   1. Hospital follow up - Patient given information about his hospital stay and when he returns to [place] his doctor will request these records. Will recheck blood cultures given 1/4 + for corynebacterium diphteroid. Unlikely to be true positive given his benign course and single positive culture. He will take last dose of azithromycin and call clinic back if  his diarrhea worsens or does not improve. Will check BMP today given low K at discharge and continued loose stools.

## 2013-09-12 NOTE — Assessment & Plan Note (Signed)
Checking BMP today.

## 2013-09-12 NOTE — Assessment & Plan Note (Signed)
Will complete course of antibiotics and then need follow up CXR around 10/13/13. Repeating blood cultures for 1/4 positive corynebacterium diphtheroids.

## 2013-09-12 NOTE — Patient Instructions (Signed)
We will check your potassium levels and repeat the blood test for bacteria.   We will give you the information to get your records from here sent to [place] when you return.   If you have questions or problems please call us at [phone].

## 2013-09-13 LAB — BASIC METABOLIC PANEL WITH GFR
BUN: 15 mg/dL (ref 6–23)
CALCIUM: 8.7 mg/dL (ref 8.4–10.5)
CO2: 27 mEq/L (ref 19–32)
Chloride: 103 mEq/L (ref 96–112)
Creat: 0.74 mg/dL (ref 0.50–1.35)
GFR, Est African American: >89 mL/min
GFR, Est Non African American: >89 mL/min
GLUCOSE: 95 mg/dL (ref 70–99)
POTASSIUM: 3.9 meq/L (ref 3.5–5.3)
SODIUM: 140 meq/L (ref 135–145)

## 2013-09-13 NOTE — Progress Notes (Signed)
Case discussed with Dr. Kollar soon after the resident saw the patient.  We reviewed the resident's history and exam and pertinent patient test results.  I agree with the assessment, diagnosis, and plan of care documented in the resident's note. 

## 2013-09-18 LAB — CULTURE, BLOOD (SINGLE)
ORGANISM ID, BACTERIA: NO GROWTH
Organism ID, Bacteria: NO GROWTH

## 2015-02-25 IMAGING — CR DG CHEST 2V
2 series · 2 of 2 positions shown · non-contrast
Comparison: DG CHEST 2 VIEW dated 09/04/2013

CLINICAL DATA: cough

EXAM:
CHEST  2 VIEW

[w chest pa]
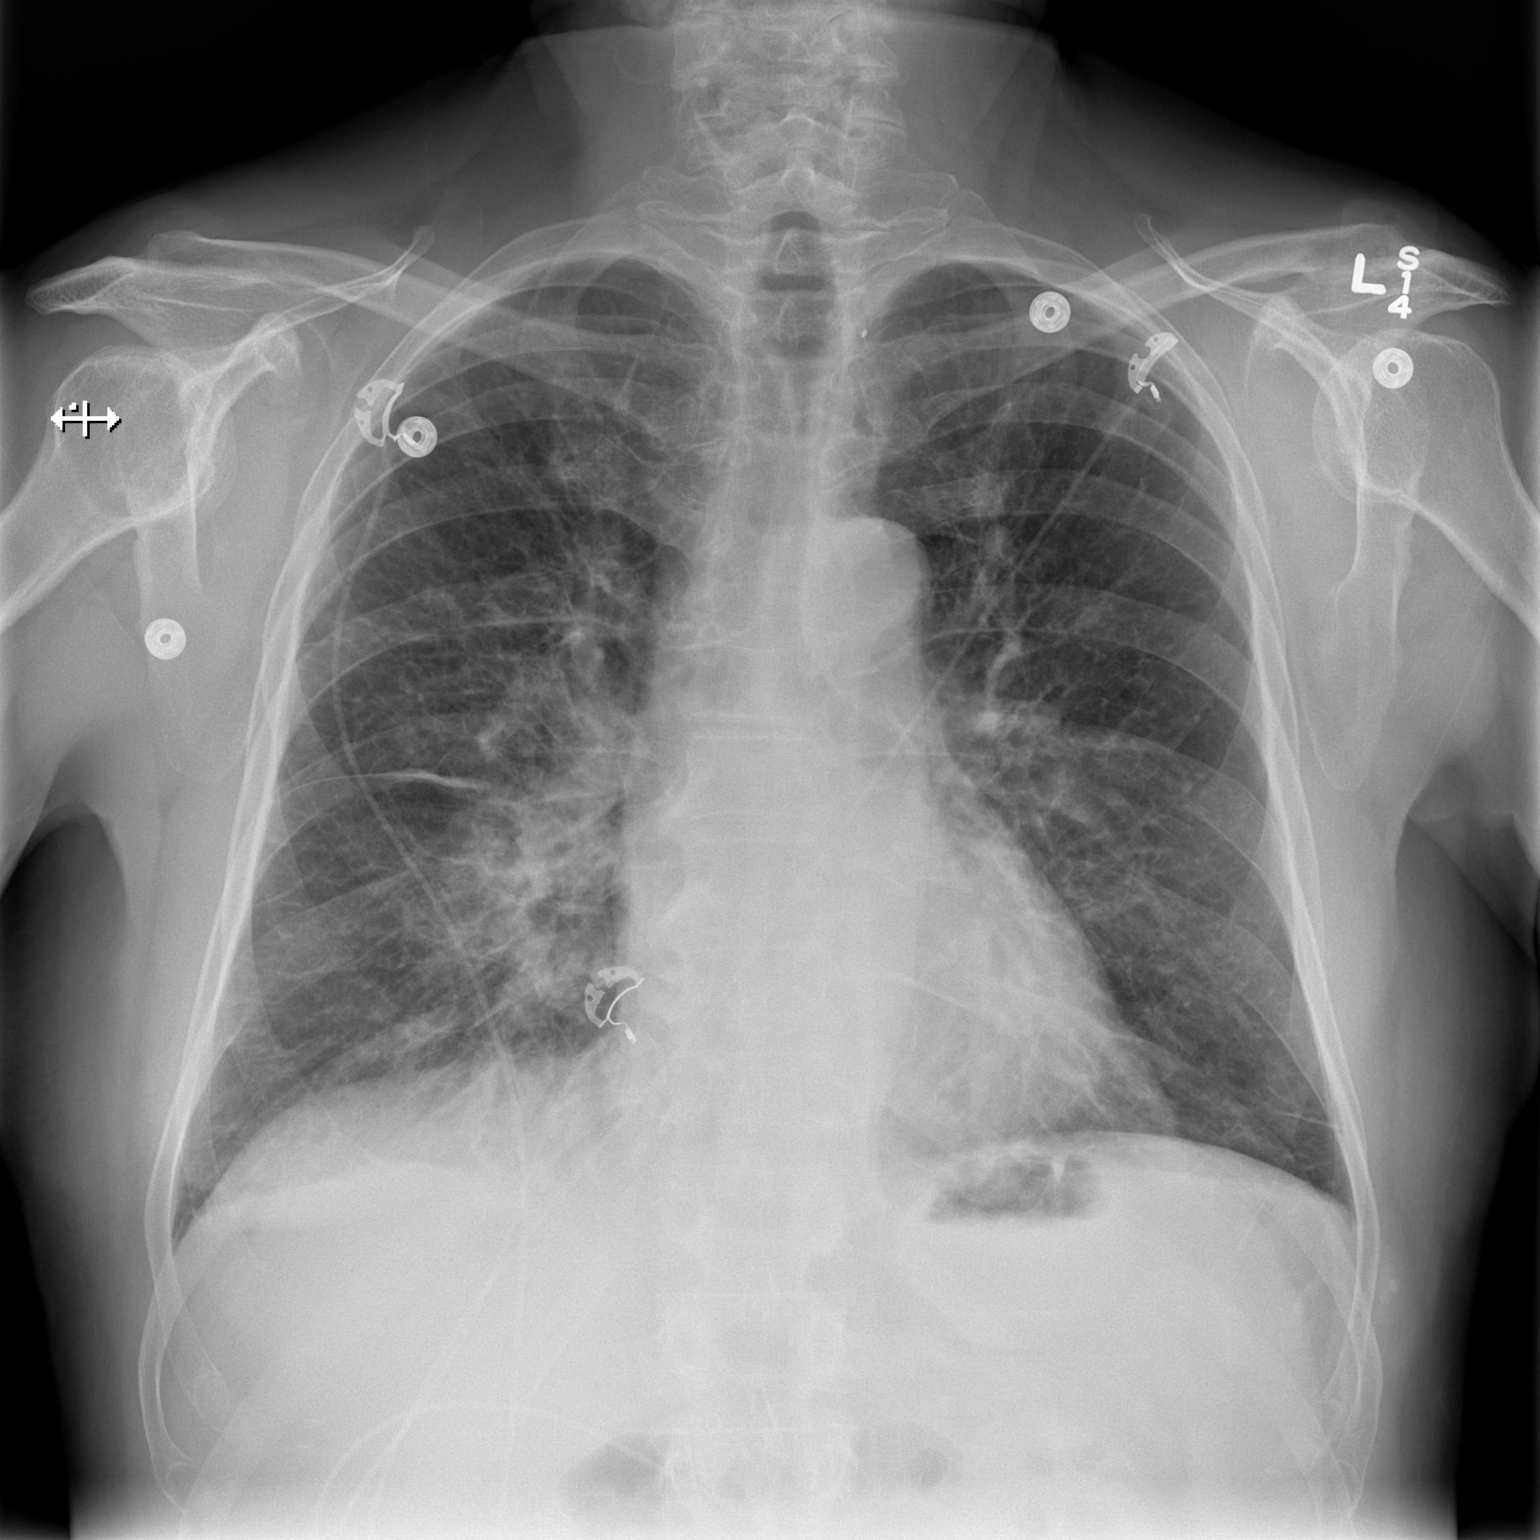

[w chest lat]
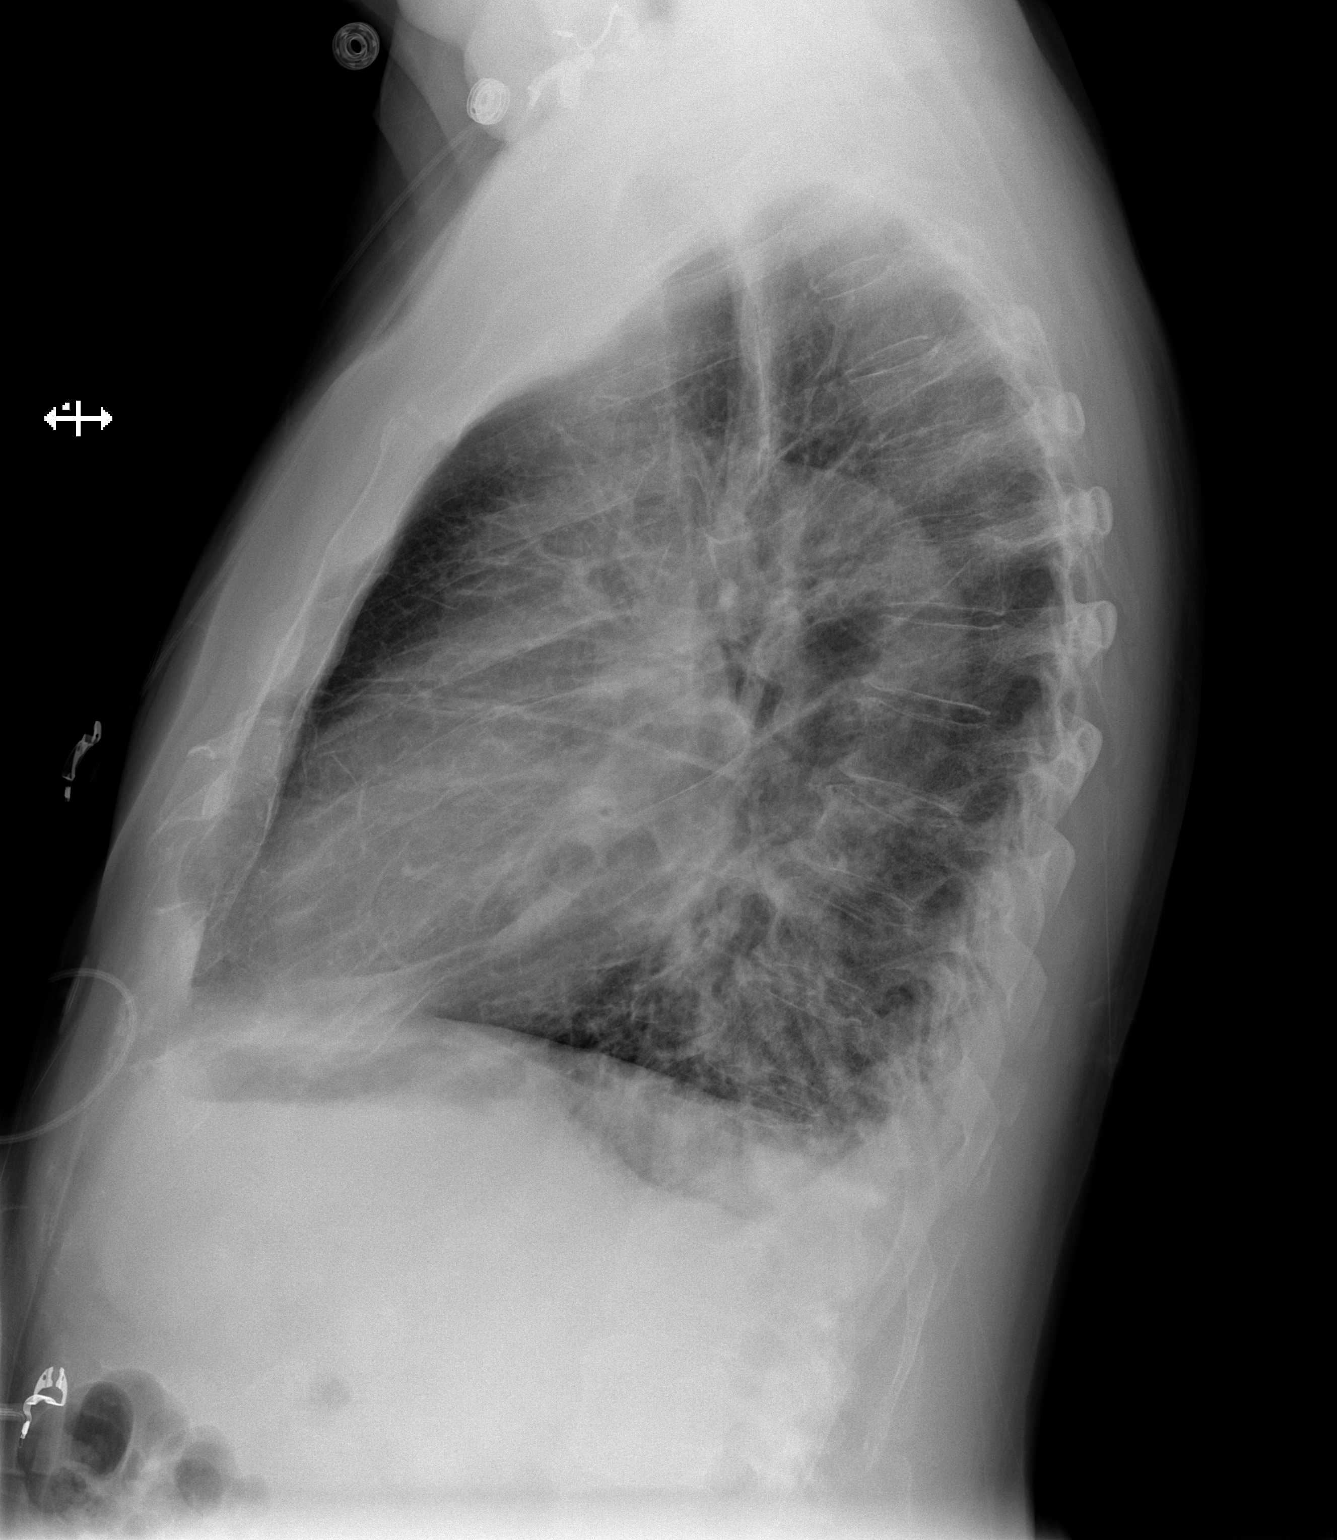

[2 of 2 positions shown; findings below may reference images not displayed]

FINDINGS: Low lung volumes. Cardiac silhouette stent normal limits.
Atherosclerotic calcifications are identified within the aorta.
There is diffuse thickening of the wrist is tissue markings. There
is no significant peribronchial cuffing. No focal regions of
consolidation appreciated. The osseous structures unremarkable.
IMPRESSION: Diffuse interstitial infiltrate. Differential considerations include
infectious or inflammatory etiologies. Noncardiogenic pulmonary
edema is also diagnostic consideration. Considering the relative
acuity of these findings the etiologies such as allergic pneumonitis
or inhalational pneumonitis if clinically appropriate cannot be
excluded. Surveillance evaluation recommended status post
appropriate therapeutic management.
